# Patient Record
Sex: Male | Born: 1960 | Race: White | Hispanic: No | Smoking: Never smoker
Health system: Southern US, Community
[De-identification: ages and names within clinical notes are randomized; demographics above are authoritative.]

## PROBLEM LIST (undated history)

## (undated) DIAGNOSIS — I2510 Atherosclerotic heart disease of native coronary artery without angina pectoris: Secondary | ICD-10-CM

## (undated) DIAGNOSIS — R002 Palpitations: Secondary | ICD-10-CM

## (undated) DIAGNOSIS — E7800 Pure hypercholesterolemia, unspecified: Secondary | ICD-10-CM

## (undated) DIAGNOSIS — I10 Essential (primary) hypertension: Secondary | ICD-10-CM

## (undated) DIAGNOSIS — N4340 Spermatocele of epididymis, unspecified: Secondary | ICD-10-CM

## (undated) DIAGNOSIS — I240 Acute coronary thrombosis not resulting in myocardial infarction: Secondary | ICD-10-CM

## (undated) DIAGNOSIS — I259 Chronic ischemic heart disease, unspecified: Secondary | ICD-10-CM

## (undated) DIAGNOSIS — Z87442 Personal history of urinary calculi: Secondary | ICD-10-CM

## (undated) DIAGNOSIS — E079 Disorder of thyroid, unspecified: Secondary | ICD-10-CM

## (undated) DIAGNOSIS — M12812 Other specific arthropathies, not elsewhere classified, left shoulder: Secondary | ICD-10-CM

## (undated) DIAGNOSIS — Z8489 Family history of other specified conditions: Secondary | ICD-10-CM

## (undated) DIAGNOSIS — R7303 Prediabetes: Secondary | ICD-10-CM

## (undated) DIAGNOSIS — Z7902 Long term (current) use of antithrombotics/antiplatelets: Secondary | ICD-10-CM

## (undated) DIAGNOSIS — I214 Non-ST elevation (NSTEMI) myocardial infarction: Secondary | ICD-10-CM

## (undated) DIAGNOSIS — I4510 Unspecified right bundle-branch block: Secondary | ICD-10-CM

## (undated) DIAGNOSIS — I519 Heart disease, unspecified: Secondary | ICD-10-CM

---

## 2006-09-01 DIAGNOSIS — E079 Disorder of thyroid, unspecified: Secondary | ICD-10-CM

## 2006-09-01 HISTORY — PX: THYROIDECTOMY, PARTIAL: SHX18

## 2006-09-01 HISTORY — PX: THYROID SURGERY: SHX805

## 2006-09-01 HISTORY — DX: Disorder of thyroid, unspecified: E07.9

## 2014-07-02 DIAGNOSIS — I2510 Atherosclerotic heart disease of native coronary artery without angina pectoris: Secondary | ICD-10-CM

## 2014-07-02 HISTORY — DX: Atherosclerotic heart disease of native coronary artery without angina pectoris: I25.10

## 2014-07-17 ENCOUNTER — Ambulatory Visit: Payer: Self-pay

## 2014-07-25 DIAGNOSIS — I10 Essential (primary) hypertension: Secondary | ICD-10-CM | POA: Insufficient documentation

## 2014-07-25 DIAGNOSIS — R002 Palpitations: Secondary | ICD-10-CM | POA: Insufficient documentation

## 2014-07-25 DIAGNOSIS — E782 Mixed hyperlipidemia: Secondary | ICD-10-CM | POA: Insufficient documentation

## 2014-07-25 DIAGNOSIS — E039 Hypothyroidism, unspecified: Secondary | ICD-10-CM | POA: Insufficient documentation

## 2014-07-25 DIAGNOSIS — I214 Non-ST elevation (NSTEMI) myocardial infarction: Secondary | ICD-10-CM

## 2014-07-25 DIAGNOSIS — E785 Hyperlipidemia, unspecified: Secondary | ICD-10-CM | POA: Insufficient documentation

## 2014-07-25 HISTORY — DX: Non-ST elevation (NSTEMI) myocardial infarction: I21.4

## 2014-07-25 HISTORY — DX: Hypothyroidism, unspecified: E03.9

## 2014-07-26 DIAGNOSIS — I214 Non-ST elevation (NSTEMI) myocardial infarction: Secondary | ICD-10-CM

## 2014-07-26 HISTORY — DX: Non-ST elevation (NSTEMI) myocardial infarction: I21.4

## 2014-07-26 HISTORY — PX: CORONARY ANGIOPLASTY WITH STENT PLACEMENT: SHX49

## 2014-08-07 DIAGNOSIS — I2510 Atherosclerotic heart disease of native coronary artery without angina pectoris: Secondary | ICD-10-CM | POA: Insufficient documentation

## 2014-08-07 DIAGNOSIS — I259 Chronic ischemic heart disease, unspecified: Secondary | ICD-10-CM | POA: Insufficient documentation

## 2014-08-07 DIAGNOSIS — I240 Acute coronary thrombosis not resulting in myocardial infarction: Secondary | ICD-10-CM | POA: Insufficient documentation

## 2015-02-03 ENCOUNTER — Ambulatory Visit
Admission: EM | Admit: 2015-02-03 | Discharge: 2015-02-03 | Disposition: A | Discharge status: Home / Self Care | Payer: 59 | Attending: Family Medicine | Admitting: Family Medicine

## 2015-02-03 ENCOUNTER — Encounter: Payer: Self-pay | Admitting: Gynecology

## 2015-02-03 DIAGNOSIS — J0100 Acute maxillary sinusitis, unspecified: Secondary | ICD-10-CM

## 2015-02-03 DIAGNOSIS — J40 Bronchitis, not specified as acute or chronic: Secondary | ICD-10-CM | POA: Diagnosis not present

## 2015-02-03 HISTORY — DX: Essential (primary) hypertension: I10

## 2015-02-03 HISTORY — DX: Disorder of thyroid, unspecified: E07.9

## 2015-02-03 HISTORY — DX: Heart disease, unspecified: I51.9

## 2015-02-03 HISTORY — DX: Pure hypercholesterolemia, unspecified: E78.00

## 2015-02-03 MED ORDER — HYDROCOD POLST-CPM POLST ER 10-8 MG/5ML PO SUER: 5.0000 mL | Freq: Two times a day (BID) | ORAL | Status: DC | PRN

## 2015-02-03 MED ORDER — AMOXICILLIN-POT CLAVULANATE 875-125 MG PO TABS: 1.0000 | ORAL_TABLET | Freq: Two times a day (BID) | ORAL | Status: DC

## 2015-02-03 MED ORDER — FEXOFENADINE-PSEUDOEPHED ER 180-240 MG PO TB24: 1.0000 | ORAL_TABLET | Freq: Every day | ORAL | Status: DC

## 2015-02-03 MED ORDER — ALBUTEROL SULFATE HFA 108 (90 BASE) MCG/ACT IN AERS: 2.0000 | INHALATION_SPRAY | RESPIRATORY_TRACT | Status: DC | PRN

## 2015-02-03 NOTE — ED Provider Notes (Signed)
CSN: [id number]     Arrival date & time 02/03/15  0806 History   First MD Initiated Contact with Patient 02/03/15 0901     Chief Complaint  Patient presents with  . Facial Pain    Patient was seen at urgent care near [facility] about a week and a half ago with his wife. He is placed on prednisone the cough medicine initially this was just for the coughing and the prednisone seemed to help. 3 days ago he started having sinus pressure and congestion.  (Consider location/radiation/quality/duration/timing/severity/associated sxs/prior Treatment) Patient is a 54 y.o. male presenting with URI. The history is provided by the patient. No language interpreter was used.  URI Presenting symptoms: congestion, cough, facial pain and rhinorrhea   Presenting symptoms: no ear pain, no fatigue, no fever and no sore throat   Congestion:    Location:  Nasal and chest   Interferes with sleep: yes     Interferes with eating/drinking: no   Cough:    Cough characteristics:  Hacking, nocturnal and productive   Sputum characteristics:  Green   Severity:  Moderate   Duration:  2 weeks   Timing:  Intermittent   Progression:  Waxing and waning   Chronicity:  New Severity:  Moderate Duration:  3 days Timing:  Constant Progression:  Worsening Chronicity:  New Relieved by:  Nothing Worsened by:  Nothing tried Associated symptoms: sinus pain and wheezing   Associated symptoms: no arthralgias, no headaches, no myalgias, no neck pain and no sneezing   Risk factors: chronic cardiac disease, recent illness and sick contacts   Risk factors: not elderly, no chronic kidney disease, no chronic respiratory disease, no diabetes mellitus, no immunosuppression and no recent travel     Past Medical History  Diagnosis Date  . Hypertension   . Thyroid disease   . Hypercholesteremia   . Heart disease    Past Surgical History  Procedure Laterality Date  . Cardiac surgery      stent placement  . Thyroid surgery     partial thyriod remove   History reviewed. No pertinent family history. History  Substance Use Topics  . Smoking status: Never Smoker   . Smokeless tobacco: Not on file  . Alcohol Use: 0.6 oz/week    1 Cans of beer per week     Comment: occassionally    Review of Systems  Constitutional: Negative for fever and fatigue.  HENT: Positive for congestion and rhinorrhea. Negative for ear pain, sneezing and sore throat.   Respiratory: Positive for cough and wheezing.   Musculoskeletal: Negative for myalgias, arthralgias and neck pain.  Neurological: Negative for headaches.  All other systems reviewed and are negative.   Allergies  Septra  Home Medications   Prior to Admission medications   Medication Sig Start Date End Date Taking? Authorizing Provider  atorvastatin (LIPITOR) 40 MG tablet Take 40 mg by mouth daily.   Yes Historical Provider, MD  levothyroxine (SYNTHROID, LEVOTHROID) 100 MCG tablet Take 10 mcg by mouth daily before breakfast.    Yes Historical Provider, MD  metoprolol succinate (TOPROL-XL) 50 MG 24 hr tablet Take 50 mg by mouth daily. Take with or immediately following a meal.   Yes Historical Provider, MD  prasugrel (EFFIENT) 10 MG TABS tablet Take 10 mg by mouth daily.   Yes Historical Provider, MD  telmisartan-hydrochlorothiazide (MICARDIS HCT) 80-25 MG per tablet Take 1 tablet by mouth daily.   Yes Historical Provider, MD  albuterol (PROVENTIL HFA;VENTOLIN HFA) 108 (90  BASE) MCG/ACT inhaler Inhale 2 puffs into the lungs every 4 (four) hours as needed for wheezing or shortness of breath. 02/03/15   Frederich Cha, MD  amoxicillin-clavulanate (AUGMENTIN) 875-125 MG per tablet Take 1 tablet by mouth 2 (two) times daily. 02/03/15   Frederich Cha, MD  chlorpheniramine-HYDROcodone [facility] PENNKINETIC ER) 10-8 MG/5ML SUER Take 5 mLs by mouth every 12 (twelve) hours as needed for cough. 02/03/15   Frederich Cha, MD  fexofenadine-pseudoephedrine (ALLEGRA-D ALLERGY & CONGESTION) 180-240  MG per 24 hr tablet Take 1 tablet by mouth daily. 02/03/15   Frederich Cha, MD   BP 117/77 mmHg  Pulse 70  Temp(Src) 97.5 F (36.4 C) (Tympanic)  Ht 5\' 8"  (1.727 m)  Wt 240 lb (108.863 kg)  BMI 36.50 kg/m2  SpO2 98% Physical Exam  Constitutional: He is oriented to person, place, and time. He appears well-developed and well-nourished. No distress.  HENT:  Head: Normocephalic and atraumatic.  Right Ear: External ear normal.  Mouth/Throat: Oropharynx is clear and moist.  Eyes: Pupils are equal, round, and reactive to light.  Neck: Normal range of motion. Neck supple. No tracheal deviation present. No thyromegaly present.  Cardiovascular: Normal rate, regular rhythm and normal heart sounds.   Pulmonary/Chest: Effort normal and breath sounds normal. No respiratory distress.  Musculoskeletal: Normal range of motion. He exhibits no edema.  Neurological: He is alert and oriented to person, place, and time.  Skin: Skin is warm. He is not diaphoretic.  Psychiatric: His behavior is normal.  Vitals reviewed.   ED Course  Procedures (including critical care time) Labs Review Labs Reviewed - No data to display  Imaging Review No results found.   MDM   1. Acute maxillary sinusitis, recurrence not specified   2. Bronchitis        Frederich Cha, MD 02/04/15 [phone]

## 2015-02-03 NOTE — Discharge Instructions (Signed)
Sinusitis Sinusitis is redness, soreness, and puffiness (inflammation) of the air pockets in the bones of your face (sinuses). The redness, soreness, and puffiness can cause air and mucus to get trapped in your sinuses. This can allow germs to grow and cause an infection.  HOME CARE   Drink enough fluids to keep your pee (urine) clear or pale yellow.  Use a humidifier in your home.  Run a hot shower to create steam in the bathroom. Sit in the bathroom with the door closed. Breathe in the steam 3-4 times a day.  Put a warm, moist washcloth on your face 3-4 times a day, or as told by your doctor.  Use salt water sprays (saline sprays) to wet the thick fluid in your nose. This can help the sinuses drain.  Only take medicine as told by your doctor. GET HELP RIGHT AWAY IF:   Your pain gets worse.  You have very bad headaches.  You are sick to your stomach (nauseous).  You throw up (vomit).  You are very sleepy (drowsy) all the time.  Your face is puffy (swollen).  Your vision changes.  You have a stiff neck.  You have trouble breathing. MAKE SURE YOU:   Understand these instructions.  Will watch your condition.  Will get help right away if you are not doing well or get worse. Document Released: 02/04/2008 Document Revised: 05/12/2012 Document Reviewed: 03/23/2012 [facility] Patient Information 2015 [place], [place]. This information is not intended to replace advice given to you by your health care provider. Make sure you discuss any questions you have with your health care provider.  Upper Respiratory Infection, Adult An upper respiratory infection (URI) is also known as the common cold. It is often caused by a type of germ (virus). Colds are easily spread (contagious). You can pass it to others by kissing, coughing, sneezing, or drinking out of the same glass. Usually, you get better in 1 or 2 weeks.  HOME CARE   Only take medicine as told by your doctor.  Use a warm mist  humidifier or breathe in steam from a hot shower.  Drink enough water and fluids to keep your pee (urine) clear or pale yellow.  Get plenty of rest.  Return to work when your temperature is back to normal or as told by your doctor. You may use a face mask and wash your hands to stop your cold from spreading. GET HELP RIGHT AWAY IF:   After the first few days, you feel you are getting worse.  You have questions about your medicine.  You have chills, shortness of breath, or brown or red spit (mucus).  You have yellow or brown snot (nasal discharge) or pain in the face, especially when you bend forward.  You have a fever, puffy (swollen) neck, pain when you swallow, or white spots in the back of your throat.  You have a bad headache, ear pain, sinus pain, or chest pain.  You have a high-pitched whistling sound when you breathe in and out (wheezing).  You have a lasting cough or cough up blood.  You have sore muscles or a stiff neck. MAKE SURE YOU:   Understand these instructions.  Will watch your condition.  Will get help right away if you are not doing well or get worse. Document Released: 02/04/2008 Document Revised: 11/10/2011 Document Reviewed: 11/23/2013 [facility] Patient Information 2015 [place], [place]. This information is not intended to replace advice given to you by your health care provider. Make  sure you discuss any questions you have with your health care provider.

## 2015-02-03 NOTE — ED Notes (Signed)
Pt. C/o sinus infection / chest cold / coughing x 1 week.

## 2016-04-09 ENCOUNTER — Ambulatory Visit
Admission: EM | Admit: 2016-04-09 | Discharge: 2016-04-09 | Disposition: A | Discharge status: Home / Self Care | Payer: 59 | Attending: Family Medicine | Admitting: Family Medicine

## 2016-04-09 DIAGNOSIS — J069 Acute upper respiratory infection, unspecified: Secondary | ICD-10-CM

## 2016-04-09 MED ORDER — DOXYCYCLINE HYCLATE 100 MG PO CAPS: 100.0000 mg | ORAL_CAPSULE | Freq: Two times a day (BID) | ORAL | 0 refills | Status: AC

## 2016-04-09 NOTE — Discharge Instructions (Signed)
Recommend take OTC cough medication such as Delsym at night to help with cough. Start Doxycycline twice a day for 7 days. Recommend follow-up with your PCP if symptoms are not improving in 3 to 5 days.

## 2016-04-09 NOTE — ED Provider Notes (Signed)
CSN: [id number]     Arrival date & time 04/09/16  1141 History   First MD Initiated Contact with Patient 04/09/16 1212     Chief Complaint  Patient presents with  . Nasal Congestion   (Consider location/radiation/quality/duration/timing/severity/associated sxs/prior Treatment) Patient presents with chest congestion and cough for over 1 week. Occasional nasal drainage. No fever. No shortness of breath. Patient is planning to go to [place] for 2 weeks and requests medication in case illness gets worse.    The history is provided by the patient.  Cough  Cough characteristics:  Productive Sputum characteristics:  Yellow Severity:  Mild Onset quality:  Sudden Duration:  1 week Timing:  Constant Progression:  Unchanged Chronicity:  New Smoker: no   Ineffective treatments:  None tried Associated symptoms: no chest pain, no chills, no fever, no headaches, no shortness of breath and no sore throat     Past Medical History:  Diagnosis Date  . Heart disease   . Hypercholesteremia   . Hypertension   . Thyroid disease    Past Surgical History:  Procedure Laterality Date  . CARDIAC SURGERY     stent placement  . THYROID SURGERY     partial thyriod remove   History reviewed. No pertinent family history. Social History  Substance Use Topics  . Smoking status: Never Smoker  . Smokeless tobacco: Never Used  . Alcohol use 0.6 oz/week    1 Cans of beer per week     Comment: occassionally    Review of Systems  Constitutional: Negative for chills and fever.  HENT: Positive for congestion and postnasal drip. Negative for sore throat.   Respiratory: Positive for cough. Negative for shortness of breath.   Cardiovascular: Negative for chest pain.  Neurological: Negative for dizziness and headaches.    Allergies  Septra [sulfamethoxazole-trimethoprim]  Home Medications   Prior to Admission medications   Medication Sig Start Date End Date Taking? Authorizing Provider  atorvastatin  (LIPITOR) 40 MG tablet Take 40 mg by mouth daily.   Yes Historical Provider, MD  levothyroxine (SYNTHROID, LEVOTHROID) 100 MCG tablet Take 10 mcg by mouth daily before breakfast.    Yes Historical Provider, MD  metoprolol succinate (TOPROL-XL) 50 MG 24 hr tablet Take 50 mg by mouth daily. Take with or immediately following a meal.   Yes Historical Provider, MD  prasugrel (EFFIENT) 10 MG TABS tablet Take 10 mg by mouth daily.   Yes Historical Provider, MD  telmisartan-hydrochlorothiazide (MICARDIS HCT) 80-25 MG per tablet Take 1 tablet by mouth daily.   Yes Historical Provider, MD  albuterol (PROVENTIL HFA;VENTOLIN HFA) 108 (90 BASE) MCG/ACT inhaler Inhale 2 puffs into the lungs every 4 (four) hours as needed for wheezing or shortness of breath. 02/03/15   Frederich Cha, MD  doxycycline (VIBRAMYCIN) 100 MG capsule Take 1 capsule (100 mg total) by mouth 2 (two) times daily. 04/09/16 04/16/16  Katy Apo, NP  fexofenadine-pseudoephedrine (ALLEGRA-D ALLERGY & CONGESTION) 180-240 MG per 24 hr tablet Take 1 tablet by mouth daily. 02/03/15   Frederich Cha, MD   Meds Ordered and Administered this Visit  Medications - No data to display  BP 135/76 (BP Location: Left Arm)   Pulse 60   Temp 97.7 F (36.5 C) (Oral)   Resp 19   Ht 5\' 8"  (1.727 m)   Wt 245 lb (111.1 kg)   SpO2 97%   BMI 37.25 kg/m  No data found.   Physical Exam  Constitutional: He is oriented to person,  place, and time. He appears well-developed and well-nourished. No distress.  HENT:  Head: Normocephalic and atraumatic.  Right Ear: Hearing, tympanic membrane, external ear and ear canal normal.  Left Ear: Hearing, tympanic membrane, external ear and ear canal normal.  Nose: Rhinorrhea present. Right sinus exhibits no maxillary sinus tenderness and no frontal sinus tenderness. Left sinus exhibits no maxillary sinus tenderness and no frontal sinus tenderness.  Mouth/Throat: Uvula is midline and mucous membranes are normal. Posterior  oropharyngeal erythema (posterior yellow drainage present) present.  Neck: Normal range of motion. Neck supple.  Cardiovascular: Normal rate, regular rhythm and normal heart sounds.   Pulmonary/Chest: Effort normal and breath sounds normal. He has no wheezes. He has no rhonchi. He has no rales.  Lymphadenopathy:    He has no cervical adenopathy.  Neurological: He is alert and oriented to person, place, and time.  Skin: Skin is warm and dry.    Urgent Care Course   Clinical Course    Procedures (including critical care time)  Labs Review Labs Reviewed - No data to display  Imaging Review No results found.   Visual Acuity Review  Right Eye Distance:   Left Eye Distance:   Bilateral Distance:    Right Eye Near:   Left Eye Near:    Bilateral Near:         MDM   1. Acute upper respiratory infection    Discussed that symptoms probably are related to a viral illness. However, since patient is traveling within the next week to areas where there may be limited health care providers, may fill Rx for Doxycycline 100mg  twice a day for 7 days. If cough and congestion worsen in the next 2 to 3 days, start Doxycycline as directed. Otherwise, use OTC Delsym at night for cough. Increase fluids to help with thinning mucus and get sufficient rest. Follow-up with your PCP when returning if symptoms has not resolved.    Katy Apo, NP 04/09/16 1630

## 2016-04-09 NOTE — ED Triage Notes (Signed)
Patient complains of chest tightness and head congestion, cough and drainage for about a week.

## 2016-08-01 HISTORY — PX: COLONOSCOPY: SHX174

## 2016-08-15 IMAGING — CR DG CHEST 2V
2 series · 2 of 2 positions shown · non-contrast
Comparison: None.

CLINICAL DATA: Trouble catching breath after being active for 5
days

EXAM:
CHEST  2 VIEW

[chest lat]
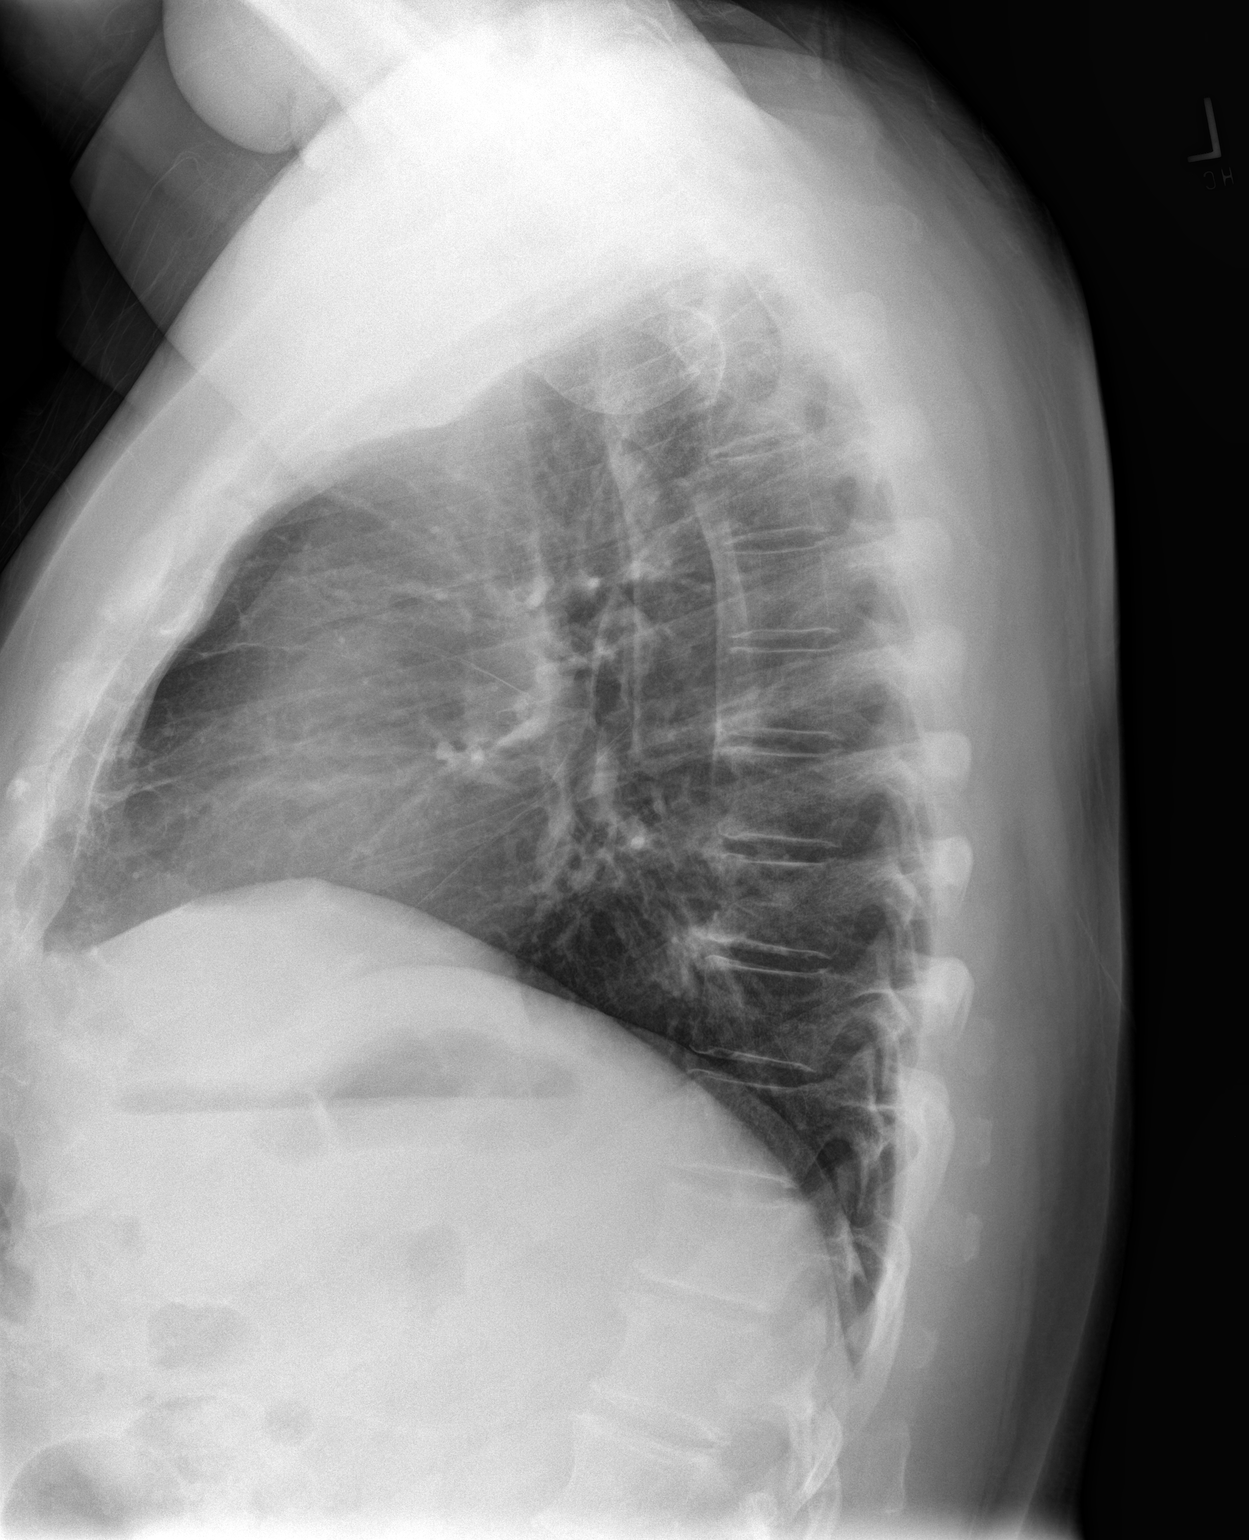

[chest pa]
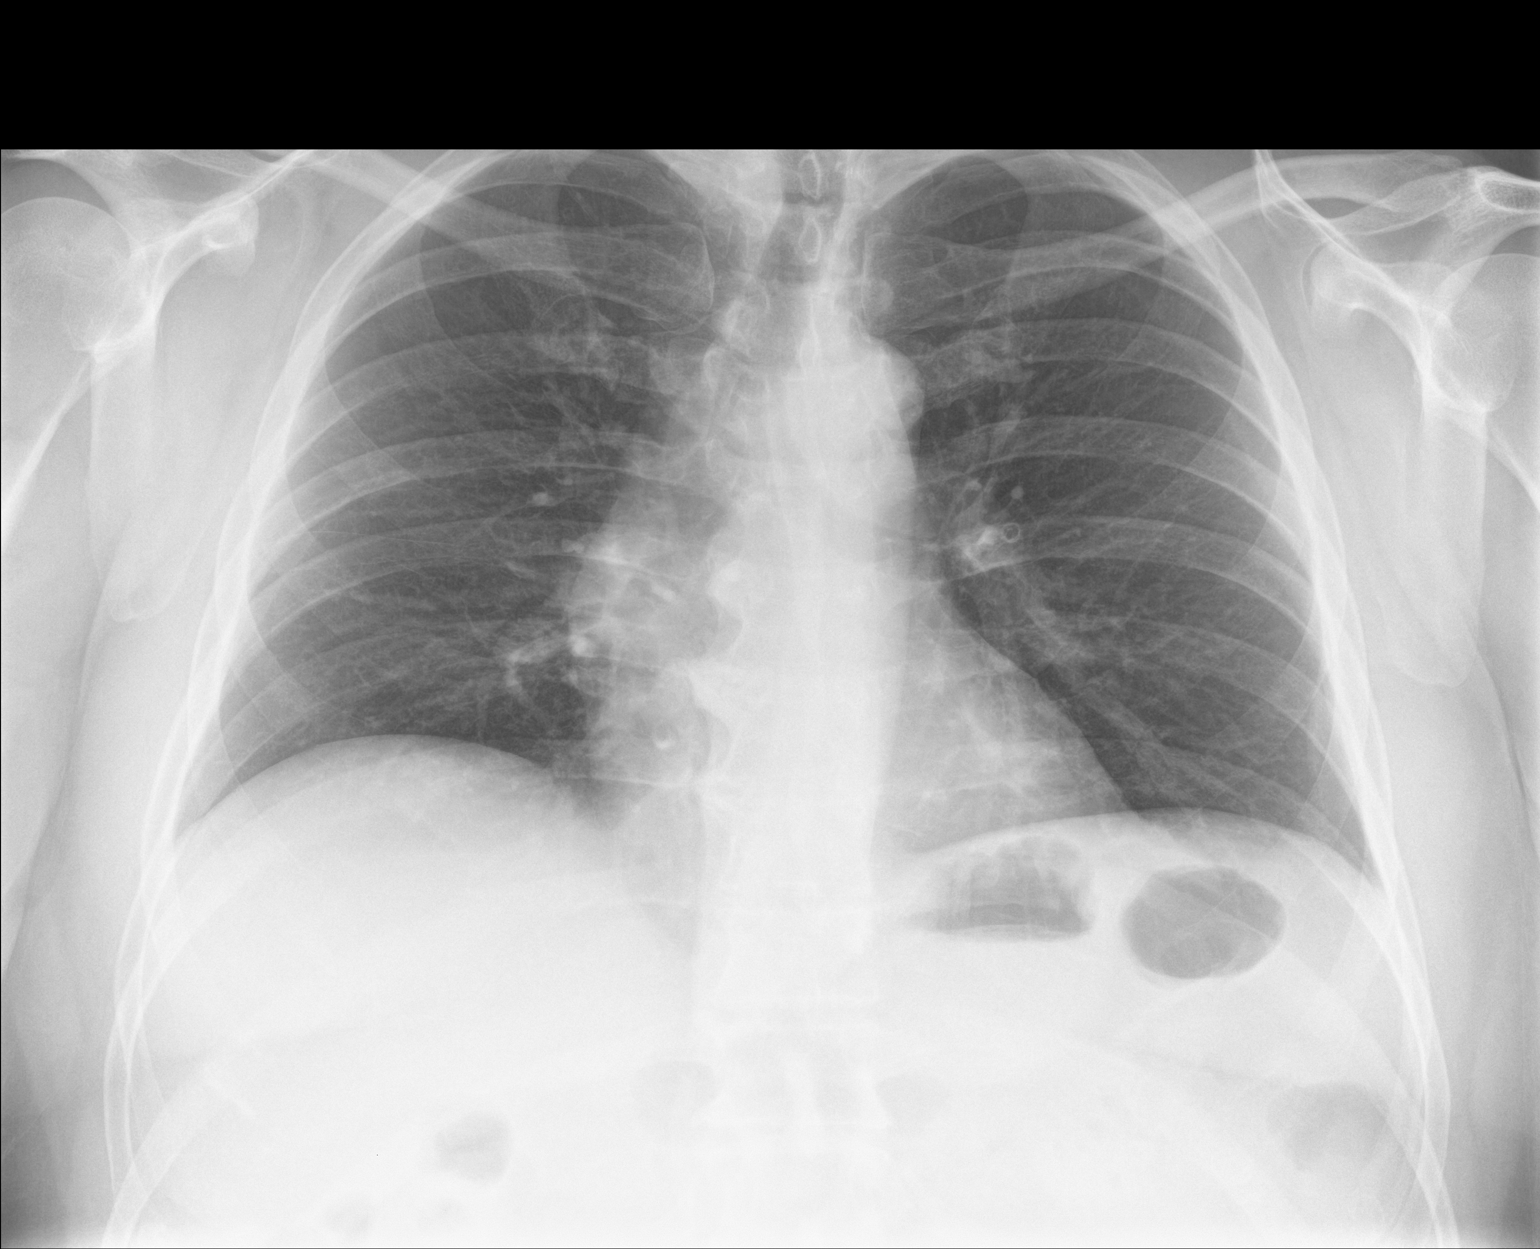

[2 of 2 positions shown; findings below may reference images not displayed]

FINDINGS: The heart size and mediastinal contours are within normal limits.
Both lungs are clear. The visualized skeletal structures are
unremarkable.
IMPRESSION: No active cardiopulmonary disease.

## 2016-09-24 ENCOUNTER — Encounter: Payer: Self-pay | Admitting: *Deleted

## 2016-09-24 ENCOUNTER — Ambulatory Visit
Admission: EM | Admit: 2016-09-24 | Discharge: 2016-09-24 | Disposition: A | Discharge status: Home / Self Care | Payer: 59 | Attending: Family Medicine | Admitting: Family Medicine

## 2016-09-24 DIAGNOSIS — B9789 Other viral agents as the cause of diseases classified elsewhere: Secondary | ICD-10-CM

## 2016-09-24 DIAGNOSIS — J069 Acute upper respiratory infection, unspecified: Secondary | ICD-10-CM

## 2016-09-24 MED ORDER — ALBUTEROL SULFATE HFA 108 (90 BASE) MCG/ACT IN AERS: 2.0000 | INHALATION_SPRAY | Freq: Four times a day (QID) | RESPIRATORY_TRACT | 0 refills | Status: DC | PRN

## 2016-09-24 MED ORDER — BENZONATATE 100 MG PO CAPS: 100.0000 mg | ORAL_CAPSULE | Freq: Three times a day (TID) | ORAL | 0 refills | Status: DC | PRN

## 2016-09-24 MED ORDER — HYDROCOD POLST-CPM POLST ER 10-8 MG/5ML PO SUER: 5.0000 mL | Freq: Every evening | ORAL | 0 refills | Status: DC | PRN

## 2016-09-24 NOTE — ED Triage Notes (Signed)
Patient started having cough and headache symptoms yesterday. No other symptoms reported.

## 2016-09-24 NOTE — ED Provider Notes (Signed)
[facility] ____________________________________________  Time seen: Approximately 1250 PM  I have reviewed the triage vital signs and the nursing notes.   HISTORY  Chief Complaint Cough and Headache   HPI Richard Underwood. is a 56 y.o. male presenting for the complaints of cough and congestion symptoms since yesterday. States biggest complaint is dry cough. Reports did not sleep last night well because of cough. States occasional wheeze when pushing all air out with exhalation. Denies any other wheezing. States cough is nonproductive. States some nasal congestion and sinus congestion. Denies fevers, chills or body aches. Reports wife similar symptoms last week. Denies other known sick contacts. Reports continues to eat and drink well.  Denies chest pain, shortness of breath, abdominal pain, dysuria, extremity pain, extremity swelling or rash. Denies recent sickness. Denies recent antibiotic use.   Hoyt Koch, MD: PCP  Past Medical History:  Diagnosis Date  . Heart disease   . Hypercholesteremia   . Hypertension   . Thyroid disease     There are no active problems to display for this patient.   Past Surgical History:  Procedure Laterality Date  . CARDIAC SURGERY     stent placement  . THYROID SURGERY     partial thyriod remove     No current facility-administered medications for this encounter.   Current Outpatient Prescriptions:  .  atorvastatin (LIPITOR) 80 MG tablet, Take 80 mg by mouth daily at 6 PM., Disp: , Rfl:  .  levothyroxine (SYNTHROID, LEVOTHROID) 100 MCG tablet, Take 10 mcg by mouth daily before breakfast. , Disp: , Rfl:  .  metoprolol succinate (TOPROL-XL) 50 MG 24 hr tablet, Take 50 mg by mouth daily. Take with or immediately following a meal., Disp: , Rfl:  .  prasugrel (EFFIENT) 10 MG TABS tablet, Take 10 mg by mouth daily., Disp: , Rfl:  .  telmisartan-hydrochlorothiazide (MICARDIS HCT) 80-25 MG per tablet, Take 1 tablet by  mouth daily., Disp: , Rfl:  .  albuterol (PROVENTIL HFA;VENTOLIN HFA) 108 (90 Base) MCG/ACT inhaler, Inhale 2 puffs into the lungs every 6 (six) hours as needed for wheezing., Disp: 1 Inhaler, Rfl: 0 .  atorvastatin (LIPITOR) 40 MG tablet, Take 40 mg by mouth daily., Disp: , Rfl:  .  benzonatate (TESSALON PERLES) 100 MG capsule, Take 1 capsule (100 mg total) by mouth 3 (three) times daily as needed for cough., Disp: 15 capsule, Rfl: 0 .  chlorpheniramine-HYDROcodone (TUSSIONEX PENNKINETIC ER) 10-8 MG/5ML SUER, Take 5 mLs by mouth at bedtime as needed for cough. do not drive or operate machinery while taking as can cause drowsiness., Disp: 75 mL, Rfl: 0 .  fexofenadine-pseudoephedrine (ALLEGRA-D ALLERGY & CONGESTION) 180-240 MG per 24 hr tablet, Take 1 tablet by mouth daily., Disp: 30 tablet, Rfl: 0  Allergies Septra [sulfamethoxazole-trimethoprim]  History reviewed. No pertinent family history.  Social History Social History  Substance Use Topics  . Smoking status: Never Smoker  . Smokeless tobacco: Never Used  . Alcohol use 0.6 oz/week    1 Cans of beer per week     Comment: occassionally    Review of Systems Constitutional: No fever/chills Eyes: No visual changes. ENT: No sore throat. Cardiovascular: Denies chest pain. Respiratory: Denies shortness of breath. Gastrointestinal: No abdominal pain.  No nausea, no vomiting.  No diarrhea.  No constipation. Genitourinary: Negative for dysuria. Musculoskeletal: Negative for back pain. Skin: Negative for rash. Neurological: Negative for headaches, focal weakness or numbness.  10-point ROS otherwise negative.  ____________________________________________  PHYSICAL EXAM:  VITAL SIGNS: ED Triage Vitals  Enc Vitals Group     BP 09/24/16 1143 125/82     Pulse Rate 09/24/16 1143 (!) 59     Resp 09/24/16 1143 16     Temp 09/24/16 1143 97.8 F (36.6 C)     Temp Source 09/24/16 1143 Oral     SpO2 09/24/16 1143 99 %     Weight  09/24/16 1144 250 lb (113.4 kg)     Height 09/24/16 1144 5\' 8"  (1.727 m)     Head Circumference --      Peak Flow --      Pain Score 09/24/16 1147 0     Pain Loc --      Pain Edu? --      Excl. in [place]? --    Constitutional: Alert and oriented. Well appearing and in no acute distress. Eyes: Conjunctivae are normal. PERRL. EOMI. Head: Atraumatic. No sinus tenderness to palpation. No swelling. No erythema.  Ears: no erythema, normal TMs bilaterally.   Nose:Nasal congestion. No rhinorrhea.   Mouth/Throat: Mucous membranes are moist. No pharyngeal erythema. No tonsillar swelling or exudate.  Neck: No stridor.  No cervical spine tenderness to palpation. Hematological/Lymphatic/Immunilogical: No cervical lymphadenopathy. Cardiovascular: Normal rate, regular rhythm. Grossly normal heart sounds.  Good peripheral circulation. Respiratory: Normal respiratory effort.  No retractions. No wheezes, rales or rhonchi. Good air movement. Dry intermittent cough noted in room.  Gastrointestinal: Soft and nontender.  No CVA tenderness. Musculoskeletal: Ambulatory with steady gait. No cervical, thoracic or lumbar tenderness to palpation. Neurologic:  Normal speech and language. No gait instability. Skin:  Skin appears warm, dry and intact. No rash noted. Psychiatric: Mood and affect are normal. Speech and behavior are normal. ___________________________________________   LABS (all labs ordered are listed, but only abnormal results are displayed)  Labs Reviewed - No data to display   PROCEDURES Procedures    INITIAL IMPRESSION / ASSESSMENT AND PLAN / ED COURSE  Pertinent labs & imaging results that were available during my care of the patient were reviewed by me and considered in my medical decision making (see chart for details).  Well-appearing patient. No acute distress. Presents for the complaints of 2 days of symptoms. Suspect viral upper respiratory infection. Discussed evaluation and  treatment of influenza, patient declined and declines Tamiflu. Will treat supportively with when necessary Tessalon Perles and when necessary Tussionex. Encourage rest, fluids and supportive care. Discussed strict follow-up and return parameters. Discussed indication, risks and benefits of medications with patient.  Discussed follow up with Primary care physician this week. Discussed follow up and return parameters including no resolution or any worsening concerns. Patient verbalized understanding and agreed to plan.   ____________________________________________   FINAL CLINICAL IMPRESSION(S) / ED DIAGNOSES  Final diagnoses:  Viral URI with cough     Discharge Medication List as of 09/24/2016  1:02 PM    START taking these medications   Details  benzonatate (TESSALON PERLES) 100 MG capsule Take 1 capsule (100 mg total) by mouth 3 (three) times daily as needed for cough., Starting Wed 09/24/2016, Normal    chlorpheniramine-HYDROcodone (TUSSIONEX PENNKINETIC ER) 10-8 MG/5ML SUER Take 5 mLs by mouth at bedtime as needed for cough. do not drive or operate machinery while taking as can cause drowsiness., Starting Wed 09/24/2016, Print        Note: This dictation was prepared with Dragon dictation along with smaller phrase technology. Any transcriptional errors that result from this process are unintentional.  Marylene Land, NP 09/24/16 1731

## 2016-09-24 NOTE — Discharge Instructions (Signed)
Take medication as prescribed. Rest. Drink plenty of fluids.  ° °Follow up with your primary care physician this week as needed. Return to Urgent care for new or worsening concerns.  ° °

## 2016-11-11 ENCOUNTER — Encounter: Payer: Self-pay | Admitting: Internal Medicine

## 2016-11-11 ENCOUNTER — Other Ambulatory Visit: Payer: Self-pay | Admitting: Internal Medicine

## 2016-11-11 ENCOUNTER — Ambulatory Visit (INDEPENDENT_AMBULATORY_CARE_PROVIDER_SITE_OTHER): Payer: 59 | Admitting: Internal Medicine

## 2016-11-11 VITALS — BP 118/80 | HR 87 | Ht 68.0 in | Wt 258.0 lb

## 2016-11-11 DIAGNOSIS — E291 Testicular hypofunction: Secondary | ICD-10-CM | POA: Diagnosis not present

## 2016-11-11 DIAGNOSIS — E890 Postprocedural hypothyroidism: Secondary | ICD-10-CM

## 2016-11-11 DIAGNOSIS — I10 Essential (primary) hypertension: Secondary | ICD-10-CM

## 2016-11-11 DIAGNOSIS — I2510 Atherosclerotic heart disease of native coronary artery without angina pectoris: Secondary | ICD-10-CM | POA: Diagnosis not present

## 2016-11-11 NOTE — Progress Notes (Signed)
Date:  11/11/2016   Name:  Richard Underwood [facility].   DOB:  [birth year]   MRN:  [id number]   Chief Complaint: [place] (Pt previous doctor is in [place], [place] and would like closer doctor for medication refills. Dr. Kenton Kingfisher. Need refill on levothyroxine. Pt would like to schedule appt for physical exam this year.)  Thyroid Problem  Presents for follow-up visit. Symptoms include palpitations. Patient reports no anxiety, cold intolerance, constipation, diaphoresis, fatigue, hair loss or leg swelling. The symptoms have been stable. His past medical history is significant for hyperlipidemia.  Hypertension  This is a chronic problem. The current episode started more than 1 year ago. The problem is unchanged. The problem is controlled. Associated symptoms include palpitations. Pertinent negatives include no chest pain, headaches or shortness of breath. Identifiable causes of hypertension include a thyroid problem.  Hyperlipidemia  This is a chronic problem. Pertinent negatives include no chest pain or shortness of breath. Current antihyperlipidemic treatment includes statins. The current treatment provides significant improvement of lipids.  He has been out of thyroid medication for about one month and questions if he needs it. He has hx of low testosterone and used injections in the past.  He has noticed decreased spontaneous erections recently so started taking an otc supplement.  He is wondering if he needs to be tested and start testosterone again.  Review of Systems  Constitutional: Negative for diaphoresis, fatigue and unexpected weight change.  Eyes: Negative for visual disturbance.  Respiratory: Negative for cough, chest tightness, shortness of breath and wheezing.   Cardiovascular: Positive for palpitations. Negative for chest pain and leg swelling.  Gastrointestinal: Negative for abdominal pain and constipation.  Endocrine: Negative for cold intolerance.  Genitourinary: Negative for  testicular pain.       Mild ED  Musculoskeletal: Negative for arthralgias and gait problem.  Skin: Negative for color change and rash.  Neurological: Negative for dizziness and headaches.  Hematological: Negative for adenopathy. Bruises/bleeds easily (due to prasugrel).  Psychiatric/Behavioral: Negative for dysphoric mood and sleep disturbance. The patient is not nervous/anxious.     Patient Active Problem List   Diagnosis Date Noted  . Coronary artery disease involving native coronary artery of native heart without angina pectoris 08/07/2014  . Hyperlipidemia 07/25/2014  . Hypertension 07/25/2014  . Hypothyroidism 07/25/2014  . Palpitations 07/25/2014    Prior to Admission medications   Medication Sig Start Date End Date Taking? Authorizing Provider  aspirin EC 81 MG tablet Take 81 mg by mouth.   Yes Historical Provider, MD  atorvastatin (LIPITOR) 80 MG tablet Take 80 mg by mouth daily at 6 PM.   Yes Historical Provider, MD  levothyroxine (SYNTHROID, LEVOTHROID) 25 MCG tablet Take 25 mcg by mouth daily before breakfast.   Yes Historical Provider, MD  metoprolol succinate (TOPROL-XL) 50 MG 24 hr tablet Take 50 mg by mouth daily. Take with or immediately following a meal.   Yes Historical Provider, MD  prasugrel (EFFIENT) 10 MG TABS tablet Take 10 mg by mouth daily.   Yes Historical Provider, MD  telmisartan-hydrochlorothiazide (MICARDIS HCT) 80-25 MG per tablet Take 1 tablet by mouth daily.   Yes Historical Provider, MD    Allergies  Allergen Reactions  . Septra [Sulfamethoxazole-Trimethoprim]     Blistered on penis  . Tetracyclines & Related Rash    Blister of skin    Past Surgical History:  Procedure Laterality Date  . COLONOSCOPY  08/2016   Physcian requested to do again in 3  years. Polyps were found.  . CORONARY ANGIOPLASTY WITH STENT PLACEMENT  07/26/2014   stent placement LAD  . THYROID SURGERY     partial thyriod remove    Social History  Substance Use Topics  .  Smoking status: Never Smoker  . Smokeless tobacco: Never Used  . Alcohol use 0.6 oz/week    1 Cans of beer per week     Comment: occassionally     Medication list has been reviewed and updated.   Physical Exam  Constitutional: He is oriented to person, place, and time. He appears well-developed. No distress.  HENT:  Head: Normocephalic and atraumatic.  Neck: Normal range of motion. Neck supple.  Cardiovascular: Normal rate, regular rhythm and normal heart sounds.   Pulmonary/Chest: Effort normal and breath sounds normal. No respiratory distress. He has no wheezes.  Musculoskeletal: Normal range of motion. He exhibits no edema or tenderness.  Neurological: He is alert and oriented to person, place, and time.  Skin: Skin is warm and dry. No rash noted.  Psychiatric: He has a normal mood and affect. His behavior is normal. Thought content normal.  Nursing note and vitals reviewed.   BP 118/80   Pulse 87   Ht 5\' 8"  (1.727 m)   Wt 258 lb (117 kg)   SpO2 95%   BMI 39.23 kg/m   Assessment and Plan: 1. Essential hypertension Controlled Call for refill when needed  2. Coronary artery disease involving native coronary artery of native heart without angina pectoris Followed by Cardiology  3. Postoperative hypothyroidism Check labs and advise on supplementation - Thyroid Panel With TSH  4. Hypogonadism in male Check AM labs - Testosterone   No orders of the defined types were placed in this encounter.   Halina Maidens, MD [facility]  11/11/2016

## 2016-11-12 ENCOUNTER — Telehealth: Payer: Self-pay

## 2016-11-12 NOTE — Telephone Encounter (Signed)
Pt called stating that he had a deep cleaning done on teeth today, and tooth will not stop bleeding. Informed pt to contact cardiologist to find out next step due to him being prescribed blood thinner EFFIENT through them.

## 2016-11-13 LAB — THYROID PANEL WITH TSH
Free Thyroxine Index: 2.1 (ref 1.2–4.9)
T3 Uptake Ratio: 27 % (ref 24–39)
T4, Total: 7.8 ug/dL (ref 4.5–12.0)
TSH: 2.19 u[IU]/mL (ref 0.450–4.500)

## 2016-11-13 LAB — TESTOSTERONE: Testosterone: 297 ng/dL (ref 264–916)

## 2017-01-01 ENCOUNTER — Other Ambulatory Visit: Payer: Self-pay | Admitting: Internal Medicine

## 2017-01-02 ENCOUNTER — Other Ambulatory Visit: Payer: Self-pay | Admitting: Internal Medicine

## 2017-04-20 ENCOUNTER — Encounter: Payer: 59 | Admitting: Internal Medicine

## 2017-04-24 ENCOUNTER — Ambulatory Visit (INDEPENDENT_AMBULATORY_CARE_PROVIDER_SITE_OTHER): Payer: 59 | Admitting: Internal Medicine

## 2017-04-24 ENCOUNTER — Encounter: Payer: Self-pay | Admitting: Internal Medicine

## 2017-04-24 ENCOUNTER — Other Ambulatory Visit
Admission: RE | Admit: 2017-04-24 | Discharge: 2017-04-24 | Disposition: A | Discharge status: Home / Self Care | Payer: 59 | Source: Ambulatory Visit | Attending: Internal Medicine | Admitting: Internal Medicine

## 2017-04-24 VITALS — BP 130/84 | HR 53 | Ht 68.0 in | Wt 230.2 lb

## 2017-04-24 DIAGNOSIS — Z0000 Encounter for general adult medical examination without abnormal findings: Secondary | ICD-10-CM | POA: Diagnosis not present

## 2017-04-24 DIAGNOSIS — Z1159 Encounter for screening for other viral diseases: Secondary | ICD-10-CM

## 2017-04-24 DIAGNOSIS — I10 Essential (primary) hypertension: Secondary | ICD-10-CM

## 2017-04-24 DIAGNOSIS — Z125 Encounter for screening for malignant neoplasm of prostate: Secondary | ICD-10-CM

## 2017-04-24 DIAGNOSIS — I2510 Atherosclerotic heart disease of native coronary artery without angina pectoris: Secondary | ICD-10-CM

## 2017-04-24 DIAGNOSIS — E782 Mixed hyperlipidemia: Secondary | ICD-10-CM

## 2017-04-24 DIAGNOSIS — N451 Epididymitis: Secondary | ICD-10-CM

## 2017-04-24 LAB — COMPREHENSIVE METABOLIC PANEL
ALT: 22 U/L (ref 17–63)
AST: 26 U/L (ref 15–41)
Albumin: 4.3 g/dL (ref 3.5–5.0)
Alkaline Phosphatase: 96 U/L (ref 38–126)
Anion gap: 8 (ref 5–15)
BUN: 18 mg/dL (ref 6–20)
CO2: 30 mmol/L (ref 22–32)
CREATININE: 0.96 mg/dL (ref 0.61–1.24)
Calcium: 9.2 mg/dL (ref 8.9–10.3)
Chloride: 100 mmol/L — ABNORMAL LOW (ref 101–111)
Glucose, Bld: 108 mg/dL — ABNORMAL HIGH (ref 65–99)
Potassium: 4 mmol/L (ref 3.5–5.1)
Sodium: 138 mmol/L (ref 135–145)
Total Bilirubin: 0.8 mg/dL (ref 0.3–1.2)
Total Protein: 7.6 g/dL (ref 6.5–8.1)

## 2017-04-24 LAB — POCT URINALYSIS DIPSTICK
BILIRUBIN UA: NEGATIVE
Blood, UA: NEGATIVE
GLUCOSE UA: NEGATIVE
KETONES UA: NEGATIVE
Leukocytes, UA: NEGATIVE
Nitrite, UA: NEGATIVE
SPEC GRAV UA: 1.01 (ref 1.010–1.025)
Urobilinogen, UA: 0.2 E.U./dL
pH, UA: 5 (ref 5.0–8.0)

## 2017-04-24 LAB — CBC WITH DIFFERENTIAL/PLATELET
BASOS PCT: 1 %
Basophils Absolute: 0.1 10*3/uL (ref 0–0.1)
EOS ABS: 0.2 10*3/uL (ref 0–0.7)
Eosinophils Relative: 2 %
HCT: 43.5 % (ref 40.0–52.0)
Hemoglobin: 14.4 g/dL (ref 13.0–18.0)
Lymphocytes Relative: 36 %
Lymphs Abs: 2.7 10*3/uL (ref 1.0–3.6)
MCH: 26.9 pg (ref 26.0–34.0)
MCHC: 33.2 g/dL (ref 32.0–36.0)
MCV: 81.2 fL (ref 80.0–100.0)
Monocytes Absolute: 0.8 10*3/uL (ref 0.2–1.0)
Monocytes Relative: 10 %
NEUTROS PCT: 51 %
Neutro Abs: 3.7 10*3/uL (ref 1.4–6.5)
Platelets: 178 10*3/uL (ref 150–440)
RBC: 5.36 MIL/uL (ref 4.40–5.90)
RDW: 14.8 % — AB (ref 11.5–14.5)
WBC: 7.4 10*3/uL (ref 3.8–10.6)

## 2017-04-24 LAB — LIPID PANEL
Cholesterol: 144 mg/dL (ref 0–200)
HDL: 45 mg/dL (ref >40)
LDL CALC: 86 mg/dL (ref 0–99)
TRIGLYCERIDES: 64 mg/dL (ref <150)
Total CHOL/HDL Ratio: 3.2 RATIO
VLDL: 13 mg/dL (ref 0–40)

## 2017-04-24 LAB — TSH: TSH: 2.378 u[IU]/mL (ref 0.350–4.500)

## 2017-04-24 LAB — PSA: Prostatic Specific Antigen: 1.09 ng/mL (ref 0.00–4.00)

## 2017-04-24 MED ORDER — CIPROFLOXACIN HCL 500 MG PO TABS: 500.0000 mg | ORAL_TABLET | Freq: Two times a day (BID) | ORAL | 0 refills | Status: AC

## 2017-04-24 NOTE — Progress Notes (Signed)
Date:  04/24/2017   Name:  Richard Underwood Richard Underwood.   DOB:  [birth year]   MRN:  [id number]   Chief Complaint: Annual Exam Richard Underwood. is a 56 y.o. male who presents today for his Complete Annual Exam. He feels well. He reports exercising regularly. He reports he is sleeping well. He is losing weight on the Keto diet.  Hypertension  This is a chronic problem. The problem is controlled. Pertinent negatives include no chest pain, headaches, palpitations or shortness of breath. Past treatments include beta blockers, angiotensin blockers and diuretics. The current treatment provides significant improvement.  Hyperlipidemia  This is a chronic problem. Recent lipid tests were reviewed and are normal. Pertinent negatives include no chest pain, myalgias or shortness of breath. Current antihyperlipidemic treatment includes statins.  Testicular pain - she reports left testicular pain and swelling. This has been a recurrent issue over the past several years. Ultrasounds of showed a varicocele but no other abnormality. He denies penile discharge fever or chills.  CAD - stable with no recurrent angina.  Now on Effient indefinitely per cardiology.   Review of Systems  Constitutional: Negative for appetite change, chills, diaphoresis, fatigue and unexpected weight change.  HENT: Negative for hearing loss, tinnitus, trouble swallowing and voice change.   Eyes: Negative for visual disturbance.  Respiratory: Negative for choking, shortness of breath and wheezing.   Cardiovascular: Negative for chest pain, palpitations and leg swelling.  Gastrointestinal: Negative for abdominal pain, blood in stool, constipation and diarrhea.  Genitourinary: Positive for scrotal swelling and testicular pain. Negative for difficulty urinating, discharge, dysuria, frequency and hematuria.  Musculoskeletal: Negative for arthralgias, back pain and myalgias.  Skin: Negative for color change and rash.  Neurological:  Negative for dizziness, syncope and headaches.  Hematological: Negative for adenopathy. Bruises/bleeds easily.  Psychiatric/Behavioral: Negative for dysphoric mood and sleep disturbance.    Patient Active Problem List   Diagnosis Date Noted  . 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 Coronary artery disease involving native coronary artery of native heart without angina pectoris 08/07/2014  . Hyperlipidemia 07/25/2014  . Hypertension 07/25/2014  . Hypothyroidism 07/25/2014  . Palpitations 07/25/2014    Prior to Admission medications   Medication Sig Start Date End Date Taking? Authorizing Provider  aspirin EC 81 MG tablet Take 81 mg by mouth.   Yes [provider]  atorvastatin (LIPITOR) 80 MG tablet TAKE 1 TABLET EVERY DAY 01/02/17  Yes Glean Hess, MD  metoprolol succinate (TOPROL-XL) 50 MG 24 hr tablet Take 50 mg by mouth daily. Take with or immediately following a meal.   Yes [provider]  prasugrel (EFFIENT) 10 MG TABS tablet Take 10 mg by mouth daily.   Yes [provider]  telmisartan-hydrochlorothiazide (MICARDIS HCT) 80-25 MG tablet 1 BY MOUTH DAILY 01/02/17  Yes Glean Hess, MD    Allergies  Allergen Reactions  . Septra [Sulfamethoxazole-Trimethoprim]     Blistered on penis  . Tetracyclines & Related Rash    Blister of skin    Past Surgical History:  Procedure Laterality Date  . COLONOSCOPY  08/2016   Physcian requested to do again in 3 years. Polyps were found.  . CORONARY ANGIOPLASTY WITH STENT PLACEMENT  07/26/2014   stent placement LAD  . THYROID SURGERY  2008   partial thyroidectomy for benign mass    Social History  Substance Use Topics  . Smoking status: Never Smoker  . Smokeless tobacco: Never Used  . Alcohol use 0.6  oz/week    1 Cans of beer per week     Comment: occassionally   Depression screen [facility] 2/9 04/24/2017  Decreased Interest 0  Down, Depressed, Hopeless 0  PHQ - 2 Score 0     Medication list has been  reviewed and updated.   Physical Exam  Constitutional: He is oriented to person, place, and time. He appears well-developed and well-nourished.  HENT:  Head: Normocephalic.  Right Ear: Tympanic membrane, external ear and ear canal normal.  Left Ear: Tympanic membrane, external ear and ear canal normal.  Nose: Nose normal.  Mouth/Throat: Uvula is midline and oropharynx is clear and moist.  Eyes: Pupils are equal, round, and reactive to light. Conjunctivae and EOM are normal.  Neck: Normal range of motion. Neck supple. Carotid bruit is not present. No thyromegaly present.  Cardiovascular: Normal rate, regular rhythm, normal heart sounds and intact distal pulses.   Pulmonary/Chest: Effort normal and breath sounds normal. He has no wheezes. Right breast exhibits no mass. Left breast exhibits no mass.  Abdominal: Soft. Normal appearance and bowel sounds are normal. There is no hepatosplenomegaly. There is no tenderness.    Genitourinary: Penis normal. Right testis shows no mass, no swelling and no tenderness. Left testis shows swelling and tenderness. Left testis shows no mass. Circumcised.  Musculoskeletal: Normal range of motion.  Lymphadenopathy:    He has no cervical adenopathy.  Neurological: He is alert and oriented to person, place, and time. He has normal reflexes.  Skin: Skin is warm, dry and intact.  Psychiatric: He has a normal mood and affect. His speech is normal and behavior is normal. Judgment and thought content normal.  Nursing note and vitals reviewed.   BP 130/84   Pulse (!) 53   Ht 5\' 8"  (1.727 m)   Wt 230 lb 3.2 oz (104.4 kg)   SpO2 96%   BMI 35.00 kg/m   Assessment and Plan: 1. Annual physical exam Continue diet, exercise and weight loss - POCT urinalysis dipstick  2. Prostate cancer screening DRE deferred - PSA  3. Coronary artery disease involving native coronary artery of native heart without angina pectoris stable  4. Essential  hypertension controlled - CBC with Differential/Platelet - Comprehensive metabolic panel - TSH  5. Mixed hyperlipidemia On therapy - Lipid panel  6. Need for hepatitis C screening test - Hepatitis C antibody  7. Epididymitis advil or aleve as needed - ciprofloxacin (CIPRO) 500 MG tablet; Take 1 tablet (500 mg total) by mouth 2 (two) times daily.  Dispense: 20 tablet; Refill: 0   Meds ordered this encounter  Medications  . ciprofloxacin (CIPRO) 500 MG tablet    Sig: Take 1 tablet (500 mg total) by mouth 2 (two) times daily.    Dispense:  20 tablet    Refill:  0    Halina Maidens, MD [facility]  04/24/2017

## 2017-04-25 LAB — HEPATITIS C ANTIBODY: HCV AB: 0.2 {s_co_ratio} (ref 0.0–0.9)

## 2017-06-24 ENCOUNTER — Other Ambulatory Visit: Payer: Self-pay | Admitting: Internal Medicine

## 2018-01-08 ENCOUNTER — Other Ambulatory Visit: Payer: Self-pay

## 2018-01-08 MED ORDER — TELMISARTAN-HCTZ 80-25 MG PO TABS: ORAL_TABLET | ORAL | 0 refills | Status: DC

## 2018-01-08 MED ORDER — TELMISARTAN-HCTZ 80-25 MG PO TABS: ORAL_TABLET | ORAL | 3 refills | Status: DC

## 2018-01-08 MED ORDER — ATORVASTATIN CALCIUM 80 MG PO TABS: 80.0000 mg | ORAL_TABLET | Freq: Every day | ORAL | 3 refills | Status: DC

## 2018-01-08 MED ORDER — ATORVASTATIN CALCIUM 80 MG PO TABS: 80.0000 mg | ORAL_TABLET | Freq: Every day | ORAL | 0 refills | Status: DC

## 2018-04-08 ENCOUNTER — Other Ambulatory Visit: Payer: Self-pay

## 2018-04-08 ENCOUNTER — Ambulatory Visit
Admission: EM | Admit: 2018-04-08 | Discharge: 2018-04-08 | Disposition: A | Discharge status: Home / Self Care | Payer: 59 | Attending: Family Medicine | Admitting: Family Medicine

## 2018-04-08 ENCOUNTER — Encounter: Payer: Self-pay | Admitting: Emergency Medicine

## 2018-04-08 DIAGNOSIS — R079 Chest pain, unspecified: Secondary | ICD-10-CM | POA: Insufficient documentation

## 2018-04-08 DIAGNOSIS — I2510 Atherosclerotic heart disease of native coronary artery without angina pectoris: Secondary | ICD-10-CM | POA: Insufficient documentation

## 2018-04-08 DIAGNOSIS — R7989 Other specified abnormal findings of blood chemistry: Secondary | ICD-10-CM | POA: Diagnosis not present

## 2018-04-08 DIAGNOSIS — Z79899 Other long term (current) drug therapy: Secondary | ICD-10-CM | POA: Diagnosis not present

## 2018-04-08 DIAGNOSIS — R05 Cough: Secondary | ICD-10-CM | POA: Diagnosis not present

## 2018-04-08 DIAGNOSIS — Z882 Allergy status to sulfonamides status: Secondary | ICD-10-CM | POA: Insufficient documentation

## 2018-04-08 DIAGNOSIS — Z955 Presence of coronary angioplasty implant and graft: Secondary | ICD-10-CM | POA: Diagnosis not present

## 2018-04-08 DIAGNOSIS — Z833 Family history of diabetes mellitus: Secondary | ICD-10-CM | POA: Insufficient documentation

## 2018-04-08 DIAGNOSIS — E785 Hyperlipidemia, unspecified: Secondary | ICD-10-CM | POA: Diagnosis not present

## 2018-04-08 DIAGNOSIS — E890 Postprocedural hypothyroidism: Secondary | ICD-10-CM | POA: Insufficient documentation

## 2018-04-08 DIAGNOSIS — I10 Essential (primary) hypertension: Secondary | ICD-10-CM | POA: Diagnosis not present

## 2018-04-08 DIAGNOSIS — R059 Cough, unspecified: Secondary | ICD-10-CM

## 2018-04-08 DIAGNOSIS — Z881 Allergy status to other antibiotic agents status: Secondary | ICD-10-CM | POA: Diagnosis not present

## 2018-04-08 DIAGNOSIS — Z7902 Long term (current) use of antithrombotics/antiplatelets: Secondary | ICD-10-CM | POA: Insufficient documentation

## 2018-04-08 DIAGNOSIS — Z8249 Family history of ischemic heart disease and other diseases of the circulatory system: Secondary | ICD-10-CM | POA: Diagnosis not present

## 2018-04-08 DIAGNOSIS — Z7982 Long term (current) use of aspirin: Secondary | ICD-10-CM | POA: Diagnosis not present

## 2018-04-08 LAB — COMPREHENSIVE METABOLIC PANEL
ALK PHOS: 98 U/L (ref 38–126)
ALT: 22 U/L (ref 0–44)
AST: 29 U/L (ref 15–41)
Albumin: 4.2 g/dL (ref 3.5–5.0)
Anion gap: 14 (ref 5–15)
BUN: 23 mg/dL — ABNORMAL HIGH (ref 6–20)
CALCIUM: 9.3 mg/dL (ref 8.9–10.3)
CHLORIDE: 102 mmol/L (ref 98–111)
CO2: 25 mmol/L (ref 22–32)
CREATININE: 1.34 mg/dL — AB (ref 0.61–1.24)
GFR, EST NON AFRICAN AMERICAN: 57 mL/min — AB (ref >60)
Glucose, Bld: 105 mg/dL — ABNORMAL HIGH (ref 70–99)
Potassium: 3.4 mmol/L — ABNORMAL LOW (ref 3.5–5.1)
SODIUM: 141 mmol/L (ref 135–145)
Total Bilirubin: 0.5 mg/dL (ref 0.3–1.2)
Total Protein: 7.5 g/dL (ref 6.5–8.1)

## 2018-04-08 LAB — CBC WITH DIFFERENTIAL/PLATELET
Basophils Absolute: 0.1 10*3/uL (ref 0–0.1)
Basophils Relative: 1 %
EOS ABS: 0.2 10*3/uL (ref 0–0.7)
EOS PCT: 2 %
HCT: 44.1 % (ref 40.0–52.0)
Hemoglobin: 14.7 g/dL (ref 13.0–18.0)
LYMPHS ABS: 2.9 10*3/uL (ref 1.0–3.6)
Lymphocytes Relative: 32 %
MCH: 27.9 pg (ref 26.0–34.0)
MCHC: 33.3 g/dL (ref 32.0–36.0)
MCV: 83.6 fL (ref 80.0–100.0)
MONO ABS: 0.9 10*3/uL (ref 0.2–1.0)
MONOS PCT: 10 %
Neutro Abs: 4.9 10*3/uL (ref 1.4–6.5)
Neutrophils Relative %: 55 %
PLATELETS: 174 10*3/uL (ref 150–440)
RBC: 5.27 MIL/uL (ref 4.40–5.90)
RDW: 13.7 % (ref 11.5–14.5)
WBC: 8.9 10*3/uL (ref 3.8–10.6)

## 2018-04-08 LAB — TROPONIN I

## 2018-04-08 NOTE — Discharge Instructions (Addendum)
Rest.  Increase fluid intake.  Take care  Dr. Lacinda Axon

## 2018-04-08 NOTE — ED Triage Notes (Signed)
Patient states he developed chest tightness and a cough after a bike ride on Sunday.  States he has not "felt like himself this week".

## 2018-04-08 NOTE — ED Provider Notes (Signed)
[facility]    CSN: [id number] Arrival date & time: 04/08/18  1431  History   Chief Complaint Chief Complaint  Patient presents with  . Cough   HPI  57 year old male presents with chest tightness and cough.  Started on Sunday.  He states that he took a bike ride on Sunday.  Rode 16 miles without difficulty.  He did notice some chest tightness.  He has had a mild cough which is nonproductive.  He states that he is not feeling as he normally does.  No reports of significant shortness of breath.  He is able to exert himself without difficulty.  He did state that he had some palpitations/fluttering.  No reports of chest pain.  Reports of lower extremity edema.  He endorses compliance with his medications.  He has been doing well.  He is been following with cardiology regularly.  No fever.  He does report some "hot flashes".  No known living factors.  No other associated symptoms.  No other complaints.  Past Medical History:  Diagnosis Date  . Heart disease   . Hypercholesteremia   . Hypertension   . Thyroid disease     Patient Active Problem List   Diagnosis Date Noted  . 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 Coronary artery disease involving native coronary artery of native heart without angina pectoris 08/07/2014  . Hyperlipidemia 07/25/2014  . Hypertension 07/25/2014  . Hypothyroidism 07/25/2014  . Palpitations 07/25/2014    Past Surgical History:  Procedure Laterality Date  . COLONOSCOPY  08/2016   Physcian requested to do again in 3 years. Polyps were found.  . CORONARY ANGIOPLASTY WITH STENT PLACEMENT  07/26/2014   stent placement LAD  . THYROID SURGERY  2008   partial thyroidectomy for benign mass     Home Medications    Prior to Admission medications   Medication Sig Start Date End Date Taking? Authorizing Provider  aspirin EC 81 MG tablet Take 81 mg by mouth.   Yes [provider]  atorvastatin (LIPITOR) 80 MG tablet Take 1 tablet (80 mg  total) by mouth daily. 01/08/18  Yes Glean Hess, MD  metoprolol succinate (TOPROL-XL) 50 MG 24 hr tablet Take 50 mg by mouth daily. Take with or immediately following a meal.   Yes [provider]  prasugrel (EFFIENT) 10 MG TABS tablet Take 10 mg by mouth daily.   Yes [provider]  telmisartan-hydrochlorothiazide (MICARDIS HCT) 80-25 MG tablet 1 BY MOUTH DAILY 01/08/18  Yes Glean Hess, MD    Family History Family History  Problem Relation Age of Onset  . Diabetes Mother   . Hypertension Mother   . Diabetes Maternal Grandmother   . Diabetes Maternal Grandfather     Social History Social History   Tobacco Use  . Smoking status: Never Smoker  . Smokeless tobacco: Never Used  Substance Use Topics  . Alcohol use: Yes    Alcohol/week: 1.0 standard drinks    Types: 1 Cans of beer per week    Comment: occassionally  . Drug use: No     Allergies   Septra [sulfamethoxazole-trimethoprim] and Tetracyclines & related   Review of Systems Review of Systems  Constitutional: Negative.   Respiratory: Positive for cough and chest tightness.    Physical Exam Triage Vital Signs ED Triage Vitals  Enc Vitals Group     BP 04/08/18 1449 (!) 155/91     Pulse Rate 04/08/18 1449 69     Resp 04/08/18  1449 18     Temp 04/08/18 1449 98.5 F (36.9 C)     Temp Source 04/08/18 1449 Oral     SpO2 04/08/18 1449 98 %     Weight 04/08/18 1444 226 lb (102.5 kg)     Height 04/08/18 1444 5\' 8"  (1.727 m)     Head Circumference --      Peak Flow --      Pain Score 04/08/18 1443 0     Pain Loc --      Pain Edu? --      Excl. in [place]? --    Updated Vital Signs BP (!) 155/91 (BP Location: Left Arm)   Pulse 69   Temp 98.5 F (36.9 C) (Oral)   Resp 18   Ht 5\' 8"  (1.727 m)   Wt 102.5 kg   SpO2 98%   BMI 34.36 kg/m   Visual Acuity Right Eye Distance:   Left Eye Distance:   Bilateral Distance:    Right Eye Near:   Left Eye Near:    Bilateral Near:      Physical Exam  Constitutional: He is oriented to person, place, and time. He appears well-developed. No distress.  HENT:  Head: Normocephalic and atraumatic.  Cardiovascular: Normal rate and regular rhythm.  Pulmonary/Chest: Effort normal and breath sounds normal. He has no wheezes. He has no rales.  Neurological: He is alert and oriented to person, place, and time.  Psychiatric: He has a normal mood and affect. His behavior is normal.  Nursing note and vitals reviewed.  UC Treatments / Results  Labs (all labs ordered are listed, but only abnormal results are displayed) Labs Reviewed  COMPREHENSIVE METABOLIC PANEL - Abnormal; Notable for the following components:      Result Value   Potassium 3.4 (*)    Glucose, Bld 105 (*)    BUN 23 (*)    Creatinine, Ser 1.34 (*)    GFR calc non Af Amer 57 (*)    All other components within normal limits  CBC WITH DIFFERENTIAL/PLATELET  TROPONIN I    EKG Interpretation: Normal sinus rhythm at the rate of 66.  Right bundle branch block.  No signs of ischemia.  Unchanged from prior EKG.  Radiology No results found.  Procedures Procedures (including critical care time)  Medications Ordered in UC Medications - No data to display  Initial Impression / Assessment and Plan / UC Course  I have reviewed the triage vital signs and the nursing notes.  Pertinent labs & imaging results that were available during my care of the patient were reviewed by me and considered in my medical decision making (see chart for details).    57 year old male presents with cough and chest tightness.  Patient has presented atypically regarding angina before.  As a result, EKG and laboratory studies were obtained.  Troponin negative.  EKG unchanged.  Lungs clear.  Does not appear to be secondary to acute bronchitis or pneumonia.  Likely viral.  Advised supportive care.  Offered albuterol, prednisone, and cough medication and patient elected to forego this.  Note,  patient had a slightly elevated creatinine.  Advised hydration.  Supportive care.  Final Clinical Impressions(s) / UC Diagnoses   Final diagnoses:  Cough  Elevated serum creatinine     Discharge Instructions     Rest.  Increase fluid intake.  Take care  Dr. Lacinda Axon     ED Prescriptions    None     Controlled Substance Prescriptions [place]  Controlled Substance Registry consulted? Not Applicable   Coral Spikes, DO 04/08/18 1610

## 2018-07-16 ENCOUNTER — Ambulatory Visit (INDEPENDENT_AMBULATORY_CARE_PROVIDER_SITE_OTHER): Payer: 59 | Admitting: Internal Medicine

## 2018-07-16 ENCOUNTER — Encounter: Payer: Self-pay | Admitting: Internal Medicine

## 2018-07-16 VITALS — BP 118/86 | HR 66 | Ht 68.0 in | Wt 239.0 lb

## 2018-07-16 DIAGNOSIS — I10 Essential (primary) hypertension: Secondary | ICD-10-CM

## 2018-07-16 DIAGNOSIS — E782 Mixed hyperlipidemia: Secondary | ICD-10-CM

## 2018-07-16 DIAGNOSIS — Z125 Encounter for screening for malignant neoplasm of prostate: Secondary | ICD-10-CM | POA: Diagnosis not present

## 2018-07-16 DIAGNOSIS — Z0000 Encounter for general adult medical examination without abnormal findings: Secondary | ICD-10-CM | POA: Diagnosis not present

## 2018-07-16 DIAGNOSIS — Z23 Encounter for immunization: Secondary | ICD-10-CM | POA: Diagnosis not present

## 2018-07-16 DIAGNOSIS — Z6836 Body mass index (BMI) 36.0-36.9, adult: Secondary | ICD-10-CM | POA: Diagnosis not present

## 2018-07-16 DIAGNOSIS — E890 Postprocedural hypothyroidism: Secondary | ICD-10-CM | POA: Diagnosis not present

## 2018-07-16 DIAGNOSIS — D126 Benign neoplasm of colon, unspecified: Secondary | ICD-10-CM | POA: Diagnosis not present

## 2018-07-16 LAB — POCT URINALYSIS DIPSTICK
Bilirubin, UA: NEGATIVE
Blood, UA: NEGATIVE
GLUCOSE UA: NEGATIVE
Ketones, UA: NEGATIVE
LEUKOCYTES UA: NEGATIVE
Nitrite, UA: NEGATIVE
Protein, UA: POSITIVE — AB
Spec Grav, UA: 1.01 (ref 1.010–1.025)
Urobilinogen, UA: 0.2 E.U./dL
pH, UA: 5 (ref 5.0–8.0)

## 2018-07-16 NOTE — Patient Instructions (Signed)
Tdap Vaccine (Tetanus, Diphtheria and Pertussis): What You Need to Know 1. Why get vaccinated? Tetanus, diphtheria and pertussis are very serious diseases. Tdap vaccine can protect us from these diseases. And, Tdap vaccine given to pregnant women can protect newborn babies against pertussis. TETANUS (Lockjaw) is rare in the [place] today. It causes painful muscle tightening and stiffness, usually all over the body.  It can lead to tightening of muscles in the head and neck so you can't open your mouth, swallow, or sometimes even breathe. Tetanus kills about 1 out of 10 people who are infected even after receiving the best medical care.  DIPHTHERIA is also rare in the [place] today. It can cause a thick coating to form in the back of the throat.  It can lead to breathing problems, heart failure, paralysis, and death.  PERTUSSIS (Whooping Cough) causes severe coughing spells, which can cause difficulty breathing, vomiting and disturbed sleep.  It can also lead to weight loss, incontinence, and rib fractures. Up to 2 in 100 adolescents and 5 in 100 adults with pertussis are hospitalized or have complications, which could include pneumonia or death.  These diseases are caused by bacteria. Diphtheria and pertussis are spread from person to person through secretions from coughing or sneezing. Tetanus enters the body through cuts, scratches, or wounds. Before vaccines, as many as 200,000 cases of diphtheria, 200,000 cases of pertussis, and hundreds of cases of tetanus, were reported in the [place] each year. Since vaccination began, reports of cases for tetanus and diphtheria have dropped by about 99% and for pertussis by about 80%. 2. Tdap vaccine Tdap vaccine can protect adolescents and adults from tetanus, diphtheria, and pertussis. One dose of Tdap is routinely given at age 11 or 12. People who did not get Tdap at that age should get it as soon as possible. Tdap is especially  important for healthcare professionals and anyone having close contact with a baby younger than 12 months. Pregnant women should get a dose of Tdap during every pregnancy, to protect the newborn from pertussis. Infants are most at risk for severe, life-threatening complications from pertussis. Another vaccine, called Td, protects against tetanus and diphtheria, but not pertussis. A Td booster should be given every 10 years. Tdap may be given as one of these boosters if you have never gotten Tdap before. Tdap may also be given after a severe cut or burn to prevent tetanus infection. Your doctor or the person giving you the vaccine can give you more information. Tdap may safely be given at the same time as other vaccines. 3. Some people should not get this vaccine  A person who has ever had a life-threatening allergic reaction after a previous dose of any diphtheria, tetanus or pertussis containing vaccine, OR has a severe allergy to any part of this vaccine, should not get Tdap vaccine. Tell the person giving the vaccine about any severe allergies.  Anyone who had coma or long repeated seizures within 7 days after a childhood dose of DTP or DTaP, or a previous dose of Tdap, should not get Tdap, unless a cause other than the vaccine was found. They can still get Td.  Talk to your doctor if you: ? have seizures or another nervous system problem, ? had severe pain or swelling after any vaccine containing diphtheria, tetanus or pertussis, ? ever had a condition called Guillain-Barr Syndrome (GBS), ? aren't feeling well on the day the shot is scheduled. 4. Risks With any medicine, including   vaccines, there is a chance of side effects. These are usually mild and go away on their own. Serious reactions are also possible but are rare. Most people who get Tdap vaccine do not have any problems with it. Mild problems following Tdap: (Did not interfere with activities)  Pain where the shot was given (about  3 in 4 adolescents or 2 in 3 adults)  Redness or swelling where the shot was given (about 1 person in 5)  Mild fever of at least 100.4F (up to about 1 in 25 adolescents or 1 in 100 adults)  Headache (about 3 or 4 people in 10)  Tiredness (about 1 person in 3 or 4)  Nausea, vomiting, diarrhea, stomach ache (up to 1 in 4 adolescents or 1 in 10 adults)  Chills, sore joints (about 1 person in 10)  Body aches (about 1 person in 3 or 4)  Rash, swollen glands (uncommon)  Moderate problems following Tdap: (Interfered with activities, but did not require medical attention)  Pain where the shot was given (up to 1 in 5 or 6)  Redness or swelling where the shot was given (up to about 1 in 16 adolescents or 1 in 12 adults)  Fever over 102F (about 1 in 100 adolescents or 1 in 250 adults)  Headache (about 1 in 7 adolescents or 1 in 10 adults)  Nausea, vomiting, diarrhea, stomach ache (up to 1 or 3 people in 100)  Swelling of the entire arm where the shot was given (up to about 1 in 500).  Severe problems following Tdap: (Unable to perform usual activities; required medical attention)  Swelling, severe pain, bleeding and redness in the arm where the shot was given (rare).  Problems that could happen after any vaccine:  People sometimes faint after a medical procedure, including vaccination. Sitting or lying down for about 15 minutes can help prevent fainting, and injuries caused by a fall. Tell your doctor if you feel dizzy, or have vision changes or ringing in the ears.  Some people get severe pain in the shoulder and have difficulty moving the arm where a shot was given. This happens very rarely.  Any medication can cause a severe allergic reaction. Such reactions from a vaccine are very rare, estimated at fewer than 1 in a million doses, and would happen within a few minutes to a few hours after the vaccination. As with any medicine, there is a very remote chance of a vaccine  causing a serious injury or death. The safety of vaccines is always being monitored. For more information, visit: www.cdc.gov/vaccinesafety/ 5. What if there is a serious problem? What should I look for? Look for anything that concerns you, such as signs of a severe allergic reaction, very high fever, or unusual behavior. Signs of a severe allergic reaction can include hives, swelling of the face and throat, difficulty breathing, a fast heartbeat, dizziness, and weakness. These would usually start a few minutes to a few hours after the vaccination. What should I do?  If you think it is a severe allergic reaction or other emergency that can't wait, call 9-1-1 or get the person to the nearest hospital. Otherwise, call your doctor.  Afterward, the reaction should be reported to the Vaccine Adverse Event Reporting System (VAERS). Your doctor might file this report, or you can do it yourself through the VAERS web site at www.vaers.hhs.gov, or by calling 1-800-822-7967. ? VAERS does not give medical advice. 6. The National Vaccine Injury Compensation Program The National   Vaccine Injury Compensation Program (VICP) is a federal program that was created to compensate people who may have been injured by certain vaccines. Persons who believe they may have been injured by a vaccine can learn about the program and about filing a claim by calling 1-800-338-2382 or visiting the VICP website at www.hrsa.gov/vaccinecompensation. There is a time limit to file a claim for compensation. 7. How can I learn more?  Ask your doctor. He or she can give you the vaccine package insert or suggest other sources of information.  Call your local or state health department.  Contact the Centers for Disease Control and Prevention (CDC): ? Call 1-800-232-4636 (1-800-CDC-INFO) or ? Visit CDC's website at www.cdc.gov/vaccines CDC Tdap Vaccine VIS (10/25/13) This information is not intended to replace advice given to you by your  health care provider. Make sure you discuss any questions you have with your health care provider. Document Released: 02/17/2012 Document Revised: 05/08/2016 Document Reviewed: 05/08/2016 Elsevier Interactive Patient Education  2017 Elsevier Inc.  

## 2018-07-16 NOTE — Progress Notes (Signed)
Date:  07/16/2018   Name:  Fran Mcree [facility]   DOB:  06-Dec-1960   MRN:  [id number]   Chief Complaint: Annual Exam Alycia Rossetti Kalev Temme. is a 57 y.o. male who presents today for his Complete Annual Exam. He feels well. He reports exercising daily. He reports he is sleeping well.   Hypertension  This is a chronic problem. The problem is controlled. Associated symptoms include palpitations. Pertinent negatives include no chest pain, headaches or shortness of breath. Past treatments include beta blockers, angiotensin blockers and diuretics. The current treatment provides significant improvement.  Hyperlipidemia  This is a chronic problem. The problem is controlled. Pertinent negatives include no chest pain, myalgias or shortness of breath. Current antihyperlipidemic treatment includes statins. The current treatment provides significant improvement of lipids. There are no compliance problems.   CAD - recently seen by cardiology.  No changes made.  No new sx. He exercises bike riding regularly. He will follow up yearly.  The plan is to continue Effient for now.  Review of Systems  Constitutional: Negative for appetite change, chills, diaphoresis, fatigue and unexpected weight change.  HENT: Negative for hearing loss, tinnitus, trouble swallowing and voice change.   Eyes: Negative for visual disturbance.  Respiratory: Negative for choking, shortness of breath and wheezing.   Cardiovascular: Positive for palpitations. Negative for chest pain and leg swelling.  Gastrointestinal: Negative for abdominal pain, blood in stool, constipation and diarrhea.       Occassional gerd  Genitourinary: Negative for difficulty urinating, dysuria, frequency, hematuria and urgency.  Musculoskeletal: Positive for arthralgias (both knees intermittently). Negative for back pain and myalgias.  Skin: Negative for color change and rash.  Allergic/Immunologic: Negative for environmental allergies.  Neurological:  Negative for dizziness, syncope and headaches.  Hematological: Negative for adenopathy. Bruises/bleeds easily.  Psychiatric/Behavioral: Negative for dysphoric mood and sleep disturbance.    Patient Active Problem List   Diagnosis Date Noted  . 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 Coronary artery disease involving native coronary artery of native heart without angina pectoris 08/07/2014  . Hyperlipidemia 07/25/2014  . Hypertension 07/25/2014  . Hypothyroidism 07/25/2014  . Palpitations 07/25/2014    Allergies  Allergen Reactions  . Septra [Sulfamethoxazole-Trimethoprim]     Blistered on penis  . Tetracyclines & Related Rash    Blister of skin    Past Surgical History:  Procedure Laterality Date  . COLONOSCOPY  08/2016   Physcian requested to do again in 3 years. Polyps were found.  . CORONARY ANGIOPLASTY WITH STENT PLACEMENT  07/26/2014   stent placement LAD  . THYROID SURGERY  2008   partial thyroidectomy for benign mass    Social History   Tobacco Use  . Smoking status: Never Smoker  . Smokeless tobacco: Never Used  Substance Use Topics  . Alcohol use: Yes    Alcohol/week: 1.0 standard drinks    Types: 1 Cans of beer per week    Comment: occassionally  . Drug use: No     Medication list has been reviewed and updated.  Current Meds  Medication Sig  . aspirin EC 81 MG tablet Take 81 mg by mouth.  Marland Kitchen atorvastatin (LIPITOR) 80 MG tablet Take 1 tablet (80 mg total) by mouth daily.  . metoprolol succinate (TOPROL-XL) 50 MG 24 hr tablet Take 50 mg by mouth daily. Take with or immediately following a meal.  . prasugrel (EFFIENT) 10 MG TABS tablet Take 10 mg by mouth daily.  Marland Kitchen telmisartan-hydrochlorothiazide (MICARDIS HCT)  80-25 MG tablet 1 BY MOUTH DAILY    PHQ 2/9 Scores 07/16/2018 04/24/2017  PHQ - 2 Score 0 0    Physical Exam  Constitutional: He is oriented to person, place, and time. He appears well-developed and well-nourished.  HENT:  Head: Normocephalic.    Right Ear: Tympanic membrane, external ear and ear canal normal.  Left Ear: Tympanic membrane, external ear and ear canal normal.  Nose: Nose normal.  Mouth/Throat: Uvula is midline and oropharynx is clear and moist.  Eyes: Pupils are equal, round, and reactive to light. Conjunctivae and EOM are normal.  Neck: Normal range of motion. Neck supple. Carotid bruit is not present. No thyromegaly present.  Cardiovascular: Normal rate, regular rhythm, normal heart sounds and intact distal pulses.  Pulmonary/Chest: Effort normal and breath sounds normal. He has no wheezes. Right breast exhibits no mass. Left breast exhibits no mass.  Abdominal: Soft. Normal appearance and bowel sounds are normal. There is no hepatosplenomegaly. There is no tenderness.  Musculoskeletal: Normal range of motion. He exhibits no edema or tenderness.  Lymphadenopathy:    He has no cervical adenopathy.  Neurological: He is alert and oriented to person, place, and time. He has normal reflexes.  Skin: Skin is warm, dry and intact.  Psychiatric: He has a normal mood and affect. His speech is normal and behavior is normal. Judgment and thought content normal.  Nursing note and vitals reviewed.   BP 118/86 (BP Location: Right Arm, Patient Position: Sitting, Cuff Size: Normal)   Pulse 66   Ht 5\' 8"  (1.727 m)   Wt 239 lb (108.4 kg)   SpO2 98%   BMI 36.34 kg/m   Assessment and Plan: 1. Annual physical exam Normal exam except for weight Continue to work on diet/exercise - POCT urinalysis dipstick  2. Prostate cancer screening DRE deferred - PSA  3. Tubular adenoma of colon Due next year for colonoscopy  4. Essential hypertension controlled - CBC with Differential/Platelet - Comprehensive metabolic panel  5. Postoperative hypothyroidism Check labs off of medications - Thyroid Panel With TSH  6. Mixed hyperlipidemia On statin therapy - Lipid panel  7. Need for diphtheria-tetanus-pertussis (Tdap)  vaccine - Tdap vaccine greater than or equal to 7yo IM  8. BMI 36.0-36.9,adult - Hemoglobin A1c   Partially dictated using Editor, commissioning. Any errors are unintentional.  Halina Maidens, MD [facility]  07/16/2018

## 2018-07-17 LAB — COMPREHENSIVE METABOLIC PANEL
A/G RATIO: 1.7 (ref 1.2–2.2)
ALK PHOS: 99 IU/L (ref 39–117)
ALT: 20 IU/L (ref 0–44)
AST: 23 IU/L (ref 0–40)
Albumin: 4.5 g/dL (ref 3.5–5.5)
BILIRUBIN TOTAL: 0.8 mg/dL (ref 0.0–1.2)
BUN/Creatinine Ratio: 15 (ref 9–20)
BUN: 15 mg/dL (ref 6–24)
CALCIUM: 9.6 mg/dL (ref 8.7–10.2)
CHLORIDE: 99 mmol/L (ref 96–106)
CO2: 24 mmol/L (ref 20–29)
Creatinine, Ser: 0.98 mg/dL (ref 0.76–1.27)
GFR calc Af Amer: 98 mL/min/{1.73_m2} (ref >59)
GFR, EST NON AFRICAN AMERICAN: 85 mL/min/{1.73_m2} (ref >59)
Globulin, Total: 2.6 g/dL (ref 1.5–4.5)
Glucose: 94 mg/dL (ref 65–99)
POTASSIUM: 3.9 mmol/L (ref 3.5–5.2)
Sodium: 139 mmol/L (ref 134–144)
Total Protein: 7.1 g/dL (ref 6.0–8.5)

## 2018-07-17 LAB — LIPID PANEL
CHOLESTEROL TOTAL: 150 mg/dL (ref 100–199)
Chol/HDL Ratio: 3 ratio (ref 0.0–5.0)
HDL: 50 mg/dL (ref >39)
LDL Calculated: 78 mg/dL (ref 0–99)
TRIGLYCERIDES: 109 mg/dL (ref 0–149)
VLDL Cholesterol Cal: 22 mg/dL (ref 5–40)

## 2018-07-17 LAB — HEMOGLOBIN A1C
Est. average glucose Bld gHb Est-mCnc: 120 mg/dL
Hgb A1c MFr Bld: 5.8 % — ABNORMAL HIGH (ref 4.8–5.6)

## 2018-07-17 LAB — CBC WITH DIFFERENTIAL/PLATELET
BASOS: 1 %
Basophils Absolute: 0.1 10*3/uL (ref 0.0–0.2)
EOS (ABSOLUTE): 0.2 10*3/uL (ref 0.0–0.4)
Eos: 2 %
HEMATOCRIT: 44.6 % (ref 37.5–51.0)
HEMOGLOBIN: 15.5 g/dL (ref 13.0–17.7)
Immature Grans (Abs): 0 10*3/uL (ref 0.0–0.1)
Immature Granulocytes: 0 %
LYMPHS ABS: 2.4 10*3/uL (ref 0.7–3.1)
Lymphs: 33 %
MCH: 28 pg (ref 26.6–33.0)
MCHC: 34.8 g/dL (ref 31.5–35.7)
MCV: 81 fL (ref 79–97)
MONOCYTES: 11 %
MONOS ABS: 0.8 10*3/uL (ref 0.1–0.9)
NEUTROS ABS: 3.7 10*3/uL (ref 1.4–7.0)
Neutrophils: 53 %
Platelets: 193 10*3/uL (ref 150–450)
RBC: 5.53 x10E6/uL (ref 4.14–5.80)
RDW: 12.8 % (ref 12.3–15.4)
WBC: 7.2 10*3/uL (ref 3.4–10.8)

## 2018-07-17 LAB — THYROID PANEL WITH TSH
FREE THYROXINE INDEX: 2.2 (ref 1.2–4.9)
T3 UPTAKE RATIO: 27 % (ref 24–39)
T4 TOTAL: 8.1 ug/dL (ref 4.5–12.0)
TSH: 2.17 u[IU]/mL (ref 0.450–4.500)

## 2018-07-17 LAB — PSA: Prostate Specific Ag, Serum: 1.1 ng/mL (ref 0.0–4.0)

## 2019-01-17 ENCOUNTER — Other Ambulatory Visit: Payer: Self-pay

## 2019-01-17 ENCOUNTER — Ambulatory Visit (INDEPENDENT_AMBULATORY_CARE_PROVIDER_SITE_OTHER): Payer: 59 | Admitting: Internal Medicine

## 2019-01-17 ENCOUNTER — Encounter: Payer: Self-pay | Admitting: Internal Medicine

## 2019-01-17 VITALS — BP 130/78 | HR 69 | Ht 68.0 in | Wt 249.2 lb

## 2019-01-17 DIAGNOSIS — Z23 Encounter for immunization: Secondary | ICD-10-CM | POA: Diagnosis not present

## 2019-01-17 DIAGNOSIS — E782 Mixed hyperlipidemia: Secondary | ICD-10-CM

## 2019-01-17 DIAGNOSIS — I10 Essential (primary) hypertension: Secondary | ICD-10-CM

## 2019-01-17 MED ORDER — ATORVASTATIN CALCIUM 80 MG PO TABS: 80.0000 mg | ORAL_TABLET | Freq: Every day | ORAL | 3 refills | Status: DC

## 2019-01-17 MED ORDER — TELMISARTAN-HCTZ 80-25 MG PO TABS: ORAL_TABLET | ORAL | 3 refills | Status: DC

## 2019-01-17 NOTE — Progress Notes (Signed)
Date:  01/17/2019   Name:  Richard Underwood [facility].   DOB:  1961-08-17   MRN:  [id number]   Chief Complaint: Hypertension (6 month follow up.)  Hypertension  This is a chronic problem. The problem is controlled. Pertinent negatives include no chest pain, headaches, palpitations or shortness of breath. Past treatments include angiotensin blockers, diuretics and beta blockers. Hypertensive end-organ damage includes CAD/MI.    Review of Systems  Constitutional: Positive for unexpected weight change. Negative for diaphoresis, fatigue and fever.  Respiratory: Negative for cough, chest tightness, shortness of breath and wheezing.   Cardiovascular: Negative for chest pain and palpitations.  Gastrointestinal: Negative for blood in stool and constipation.  Genitourinary: Negative for hematuria.  Musculoskeletal: Positive for arthralgias (right MCPs).  Neurological: Negative for dizziness, weakness, light-headedness and headaches.  Psychiatric/Behavioral: Negative for dysphoric mood and sleep disturbance. The patient is not nervous/anxious.     Patient Active Problem List   Diagnosis Date Noted  . Tubular adenoma of colon 07/16/2018  . 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 Coronary artery disease involving native coronary artery of native heart without angina pectoris 08/07/2014  . Hyperlipidemia 07/25/2014  . Hypertension 07/25/2014  . Hypothyroidism 07/25/2014  . Palpitations 07/25/2014    Allergies  Allergen Reactions  . Septra [Sulfamethoxazole-Trimethoprim]     Blistered on penis  . Tetracyclines & Related Rash    Blister of skin    Past Surgical History:  Procedure Laterality Date  . COLONOSCOPY  08/2016   Physcian requested to do again in 3 years. Polyps were found.  . CORONARY ANGIOPLASTY WITH STENT PLACEMENT  07/26/2014   stent placement LAD  . THYROID SURGERY  2008   partial thyroidectomy for benign mass    Social History   Tobacco Use  . Smoking status: Never  Smoker  . Smokeless tobacco: Never Used  Substance Use Topics  . Alcohol use: Yes    Alcohol/week: 1.0 standard drinks    Types: 1 Cans of beer per week    Comment: occassionally  . Drug use: No     Medication list has been reviewed and updated.  Current Meds  Medication Sig  . aspirin EC 81 MG tablet Take 81 mg by mouth.  Marland Kitchen atorvastatin (LIPITOR) 80 MG tablet Take 1 tablet (80 mg total) by mouth daily.  . metoprolol succinate (TOPROL-XL) 50 MG 24 hr tablet Take 50 mg by mouth daily. Take with or immediately following a meal.  . prasugrel (EFFIENT) 10 MG TABS tablet Take 10 mg by mouth daily.  Marland Kitchen telmisartan-hydrochlorothiazide (MICARDIS HCT) 80-25 MG tablet 1 BY MOUTH DAILY    PHQ 2/9 Scores 01/17/2019 07/16/2018 04/24/2017  PHQ - 2 Score 0 0 0    BP Readings from Last 3 Encounters:  01/17/19 130/78  07/16/18 118/86  04/08/18 (!) 155/91    Physical Exam Vitals signs and nursing note reviewed.  Constitutional:      General: He is not in acute distress.    Appearance: He is well-developed.  HENT:     Head: Normocephalic and atraumatic.     Mouth/Throat:     Mouth: Mucous membranes are moist.  Eyes:     Pupils: Pupils are equal, round, and reactive to light.  Neck:     Musculoskeletal: Normal range of motion and neck supple.  Cardiovascular:     Rate and Rhythm: Normal rate and regular rhythm.     Pulses: Normal pulses.  Pulmonary:     Effort: Pulmonary effort  is normal. No respiratory distress.     Breath sounds: Normal breath sounds. No wheezing or rhonchi.  Musculoskeletal: Normal range of motion.     Right lower leg: No edema.     Left lower leg: No edema.  Skin:    General: Skin is warm and dry.     Findings: No rash.  Neurological:     Mental Status: He is alert and oriented to person, place, and time.  Psychiatric:        Behavior: Behavior normal.        Thought Content: Thought content normal.     Wt Readings from Last 3 Encounters:  01/17/19 249  lb 3.2 oz (113 kg)  07/16/18 239 lb (108.4 kg)  04/08/18 226 lb (102.5 kg)    BP 130/78   Pulse 69   Ht 5\' 8"  (1.727 m)   Wt 249 lb 3.2 oz (113 kg)   SpO2 96%   BMI 37.89 kg/m   Assessment and Plan: 1. Essential hypertension controlled - telmisartan-hydrochlorothiazide (MICARDIS HCT) 80-25 MG tablet; 1 BY MOUTH DAILY  Dispense: 90 tablet; Refill: 3  2. Mixed hyperlipidemia Continue statin therapy - atorvastatin (LIPITOR) 80 MG tablet; Take 1 tablet (80 mg total) by mouth daily.  Dispense: 90 tablet; Refill: 3  3. Need for shingles vaccine First dose today - Varicella-zoster vaccine IM   Partially dictated using Editor, commissioning. Any errors are unintentional.  Halina Maidens, MD [facility]  01/17/2019

## 2019-01-18 ENCOUNTER — Telehealth: Payer: Self-pay

## 2019-01-18 NOTE — Telephone Encounter (Signed)
Patient called saying he received his Shingrix Vaccine yesterday and he is now having body aches, and low grade temp of 99.6 with a slight headache. Just wanted to be sure this can be side effects of the vaccine and he did not have sxs of [place].  Called patient back and informed him these can all be normal sxs after having any vaccine. Told him they should begin to go away in the next few days.

## 2019-01-22 ENCOUNTER — Other Ambulatory Visit: Payer: Self-pay | Admitting: Internal Medicine

## 2019-01-22 DIAGNOSIS — E782 Mixed hyperlipidemia: Secondary | ICD-10-CM

## 2019-01-22 DIAGNOSIS — I10 Essential (primary) hypertension: Secondary | ICD-10-CM

## 2019-04-06 ENCOUNTER — Encounter: Payer: 59 | Admitting: Internal Medicine

## 2019-04-19 ENCOUNTER — Ambulatory Visit: Payer: 59 | Admitting: Internal Medicine

## 2019-04-21 ENCOUNTER — Other Ambulatory Visit: Payer: Self-pay

## 2019-04-21 ENCOUNTER — Ambulatory Visit (INDEPENDENT_AMBULATORY_CARE_PROVIDER_SITE_OTHER): Payer: 59 | Admitting: Internal Medicine

## 2019-04-21 ENCOUNTER — Encounter: Payer: Self-pay | Admitting: Internal Medicine

## 2019-04-21 VITALS — BP 128/78 | HR 68 | Ht 68.0 in | Wt 248.0 lb

## 2019-04-21 DIAGNOSIS — I10 Essential (primary) hypertension: Secondary | ICD-10-CM

## 2019-04-21 DIAGNOSIS — Z23 Encounter for immunization: Secondary | ICD-10-CM | POA: Diagnosis not present

## 2019-04-21 NOTE — Progress Notes (Signed)
Date:  04/21/2019   Name:  Jepson Gazzola [facility].   DOB:  November 13, 1960   MRN:  [id number]   Chief Complaint: Employment Physical (Forms brought today by patient. ) Pt has biometric form for work.  He is feeling well with no problems. He is here to get his second Shringrix vaccine today.  Hypertension This is a chronic problem. The problem is controlled (does not check at home). Pertinent negatives include no chest pain, headaches, orthopnea, palpitations, peripheral edema, PND or shortness of breath. Past treatments include angiotensin blockers, diuretics and beta blockers. The current treatment provides significant improvement.   Lab Results  Component Value Date   CHOL 150 07/16/2018   HDL 50 07/16/2018   LDLCALC 78 07/16/2018   TRIG 109 07/16/2018   CHOLHDL 3.0 07/16/2018    Review of Systems  Constitutional: Negative for chills, fatigue and fever.  Respiratory: Negative for cough, chest tightness, shortness of breath and wheezing.   Cardiovascular: Negative for chest pain, palpitations, orthopnea and PND.  Neurological: Negative for dizziness and headaches.  Psychiatric/Behavioral: Negative for dysphoric mood and sleep disturbance.    Patient Active Problem List   Diagnosis Date Noted  . Tubular adenoma of colon 07/16/2018  . 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 Coronary artery disease involving native coronary artery of native heart without angina pectoris 08/07/2014  . Hyperlipidemia 07/25/2014  . Hypertension 07/25/2014  . Hypothyroidism 07/25/2014  . Palpitations 07/25/2014    Allergies  Allergen Reactions  . Septra [Sulfamethoxazole-Trimethoprim]     Blistered on penis  . Tetracyclines & Related Rash    Blister of skin    Past Surgical History:  Procedure Laterality Date  . COLONOSCOPY  08/2016   Physcian requested to do again in 3 years. Polyps were found.  . CORONARY ANGIOPLASTY WITH STENT PLACEMENT  07/26/2014   stent placement LAD  . THYROID SURGERY   2008   partial thyroidectomy for benign mass    Social History   Tobacco Use  . Smoking status: Never Smoker  . Smokeless tobacco: Never Used  Substance Use Topics  . Alcohol use: Yes    Alcohol/week: 1.0 standard drinks    Types: 1 Cans of beer per week    Comment: occassionally  . Drug use: No     Medication list has been reviewed and updated.  Current Meds  Medication Sig  . aspirin EC 81 MG tablet Take 81 mg by mouth.  Marland Kitchen atorvastatin (LIPITOR) 80 MG tablet TAKE 1 TABLET DAILY  . metoprolol succinate (TOPROL-XL) 50 MG 24 hr tablet Take 50 mg by mouth daily. Take with or immediately following a meal.  . prasugrel (EFFIENT) 10 MG TABS tablet Take 10 mg by mouth daily.  Marland Kitchen telmisartan-hydrochlorothiazide (MICARDIS HCT) 80-25 MG tablet TAKE 1 TABLET DAILY    PHQ 2/9 Scores 04/21/2019 01/17/2019 07/16/2018 04/24/2017  PHQ - 2 Score 0 0 0 0    BP Readings from Last 3 Encounters:  04/21/19 128/78  01/17/19 130/78  07/16/18 118/86    Physical Exam Vitals signs and nursing note reviewed.  Constitutional:      General: He is not in acute distress.    Appearance: He is well-developed.  HENT:     Head: Normocephalic and atraumatic.  Cardiovascular:     Rate and Rhythm: Normal rate and regular rhythm.     Pulses: Normal pulses.     Heart sounds: No murmur.  Pulmonary:     Effort: Pulmonary effort is normal.  No respiratory distress.  Musculoskeletal: Normal range of motion.     Right lower leg: No edema.     Left lower leg: No edema.  Skin:    General: Skin is warm and dry.     Capillary Refill: Capillary refill takes less than 2 seconds.     Findings: No rash.  Neurological:     General: No focal deficit present.     Mental Status: He is alert and oriented to person, place, and time.  Psychiatric:        Behavior: Behavior normal.        Thought Content: Thought content normal.     Wt Readings from Last 3 Encounters:  04/21/19 248 lb (112.5 kg)  01/17/19 249 lb  3.2 oz (113 kg)  07/16/18 239 lb (108.4 kg)    BP 128/78   Pulse 68   Ht 5\' 8"  (1.727 m)   Wt 248 lb (112.5 kg)   SpO2 96%   BMI 37.71 kg/m   Assessment and Plan: 1. Essential hypertension Clinically stable exam with well controlled BP.   Tolerating medications, telmisartan hct and metoprolol, without side effects at this time. Pt to continue current regimen and low sodium diet; benefits of regular exercise as able discussed. Form insurance premium reduction completed  2. Need for shingles vaccine Second dose given today   Partially dictated using Editor, commissioning. Any errors are unintentional.  Halina Maidens, MD [facility]  04/21/2019

## 2019-04-25 ENCOUNTER — Encounter: Payer: 59 | Admitting: Internal Medicine

## 2019-07-26 ENCOUNTER — Encounter: Payer: Self-pay | Admitting: Internal Medicine

## 2019-07-26 ENCOUNTER — Other Ambulatory Visit: Payer: Self-pay

## 2019-07-26 ENCOUNTER — Ambulatory Visit (INDEPENDENT_AMBULATORY_CARE_PROVIDER_SITE_OTHER): Payer: 59 | Admitting: Internal Medicine

## 2019-07-26 VITALS — BP 132/82 | HR 65 | Ht 68.0 in | Wt 246.0 lb

## 2019-07-26 DIAGNOSIS — E890 Postprocedural hypothyroidism: Secondary | ICD-10-CM

## 2019-07-26 DIAGNOSIS — Z0000 Encounter for general adult medical examination without abnormal findings: Secondary | ICD-10-CM

## 2019-07-26 DIAGNOSIS — E782 Mixed hyperlipidemia: Secondary | ICD-10-CM

## 2019-07-26 DIAGNOSIS — Z1211 Encounter for screening for malignant neoplasm of colon: Secondary | ICD-10-CM | POA: Diagnosis not present

## 2019-07-26 DIAGNOSIS — I2510 Atherosclerotic heart disease of native coronary artery without angina pectoris: Secondary | ICD-10-CM

## 2019-07-26 DIAGNOSIS — I10 Essential (primary) hypertension: Secondary | ICD-10-CM

## 2019-07-26 DIAGNOSIS — J0100 Acute maxillary sinusitis, unspecified: Secondary | ICD-10-CM

## 2019-07-26 DIAGNOSIS — Z125 Encounter for screening for malignant neoplasm of prostate: Secondary | ICD-10-CM

## 2019-07-26 LAB — POCT URINALYSIS DIPSTICK
Bilirubin, UA: NEGATIVE
Blood, UA: NEGATIVE
Glucose, UA: NEGATIVE
Ketones, UA: NEGATIVE
Leukocytes, UA: NEGATIVE
Nitrite, UA: NEGATIVE
Protein, UA: POSITIVE — AB
Spec Grav, UA: 1.02 (ref 1.010–1.025)
Urobilinogen, UA: 0.2 E.U./dL
pH, UA: 5 (ref 5.0–8.0)

## 2019-07-26 MED ORDER — AMOXICILLIN-POT CLAVULANATE 875-125 MG PO TABS
1.0000 | ORAL_TABLET | Freq: Two times a day (BID) | ORAL | 0 refills | Status: AC
Start: 2019-07-26 — End: 2019-08-05

## 2019-07-26 NOTE — Progress Notes (Signed)
Date:  07/26/2019   Name:  Algia Trapasso [facility].   DOB:  [birth year]   MRN:  [id number]   Chief Complaint: No chief complaint on file. Alycia Rossetti Marisol Linkenhoker. is a 58 y.o. male who presents today for his Complete Annual Exam. He feels fairly well. He reports exercising none. He reports he is sleeping fairly well. He has gained some weight and knows that he needs to work on diet and exercise.  He declines flu vaccine.  Colonoscopy - scheduled for January at [place] History  Administered Date(s) Administered  . Tdap 07/16/2018  . Zoster Recombinat (Shingrix) 01/17/2019, 04/21/2019    Hypertension This is a chronic problem. The problem is controlled (has not checked at home). Pertinent negatives include no chest pain, palpitations or shortness of breath. Past treatments include angiotensin blockers, diuretics and beta blockers. The current treatment provides significant improvement. There are no compliance problems.  Hypertensive end-organ damage includes CAD/MI.  Hyperlipidemia The problem is controlled. Pertinent negatives include no chest pain or shortness of breath. Current antihyperlipidemic treatment includes statins. The current treatment provides significant improvement of lipids. There are no compliance problems.   Sinusitis This is a new problem. The current episode started in the past 7 days. There has been no fever. He is experiencing no pain. Associated symptoms include congestion, coughing and sinus pressure. Pertinent negatives include no chills, diaphoresis or shortness of breath. Past treatments include acetaminophen and saline sprays. The treatment provided mild relief.  CAD - s/p stent, on statin and dual anti-platelet therapy.  Doing well, physically active with no chest pain, SOB. Last seen by cardiology last week.  Lab Results  Component Value Date   CREATININE 0.98 07/16/2018   BUN 15 07/16/2018   NA 139 07/16/2018   K 3.9 07/16/2018   CL 99 07/16/2018   CO2 24 07/16/2018   Lab Results  Component Value Date   CHOL 150 07/16/2018   HDL 50 07/16/2018   LDLCALC 78 07/16/2018   TRIG 109 07/16/2018   CHOLHDL 3.0 07/16/2018   Lab Results  Component Value Date   TSH 2.170 07/16/2018   Lab Results  Component Value Date   HGBA1C 5.8 (H) 07/16/2018     Review of Systems  Constitutional: Negative for chills, diaphoresis, fatigue and fever.  HENT: Positive for congestion and sinus pressure.   Respiratory: Positive for cough. Negative for chest tightness, shortness of breath and wheezing.   Cardiovascular: Negative for chest pain, palpitations and leg swelling.  Gastrointestinal: Negative for abdominal pain, constipation and diarrhea.  Musculoskeletal: Negative for arthralgias.  Hematological: Negative for adenopathy.  Psychiatric/Behavioral: Negative for dysphoric mood and sleep disturbance. The patient is not nervous/anxious.     Patient Active Problem List   Diagnosis Date Noted  . Tubular adenoma of colon 07/16/2018  . 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 Coronary artery disease involving native coronary artery of native heart without angina pectoris 08/07/2014  . Mixed hyperlipidemia 07/25/2014  . Essential hypertension 07/25/2014  . Palpitations 07/25/2014    Allergies  Allergen Reactions  . Septra [Sulfamethoxazole-Trimethoprim]     Blistered on penis  . Tetracyclines & Related Rash    Blister of skin    Past Surgical History:  Procedure Laterality Date  . COLONOSCOPY  08/2016   Physcian requested to do again in 3 years. Polyps were found.  . CORONARY ANGIOPLASTY WITH STENT PLACEMENT  07/26/2014   stent placement LAD  . THYROID SURGERY  2008   partial  thyroidectomy for benign mass    Social History   Tobacco Use  . Smoking status: Never Smoker  . Smokeless tobacco: Never Used  Substance Use Topics  . Alcohol use: Yes    Alcohol/week: 1.0 standard drinks    Types: 1 Cans of beer per week    Comment:  occassionally  . Drug use: No     Medication list has been reviewed and updated.  Current Meds  Medication Sig  . aspirin EC 81 MG tablet Take 81 mg by mouth.  Marland Kitchen atorvastatin (LIPITOR) 80 MG tablet TAKE 1 TABLET DAILY  . metoprolol succinate (TOPROL-XL) 50 MG 24 hr tablet Take 50 mg by mouth daily. Take with or immediately following a meal.  . Multiple Vitamin (ONE DAILY) tablet Take by mouth.  . prasugrel (EFFIENT) 10 MG TABS tablet Take 10 mg by mouth daily.  Marland Kitchen telmisartan-hydrochlorothiazide (MICARDIS HCT) 80-25 MG tablet TAKE 1 TABLET DAILY    PHQ 2/9 Scores 07/26/2019 04/21/2019 01/17/2019 07/16/2018  PHQ - 2 Score 0 0 0 0  PHQ- 9 Score 0 - - -    BP Readings from Last 3 Encounters:  07/26/19 132/82  04/21/19 128/78  01/17/19 130/78    Physical Exam Vitals signs and nursing note reviewed.  Constitutional:      Appearance: Normal appearance. He is well-developed.  HENT:     Head: Normocephalic.     Right Ear: Tympanic membrane, ear canal and external ear normal.     Left Ear: Tympanic membrane, ear canal and external ear normal.     Nose:     Right Sinus: Maxillary sinus tenderness present.     Left Sinus: Maxillary sinus tenderness present.  Eyes:     Conjunctiva/sclera: Conjunctivae normal.     Pupils: Pupils are equal, round, and reactive to light.  Neck:     Musculoskeletal: Normal range of motion and neck supple.     Thyroid: No thyromegaly.     Vascular: No carotid bruit.  Cardiovascular:     Rate and Rhythm: Normal rate and regular rhythm.     Pulses: Normal pulses.     Heart sounds: Normal heart sounds. No murmur.  Pulmonary:     Effort: Pulmonary effort is normal.     Breath sounds: Normal breath sounds. No wheezing.  Chest:     Breasts:        Right: No mass.        Left: No mass.  Abdominal:     General: Bowel sounds are normal.     Palpations: Abdomen is soft.     Tenderness: There is no abdominal tenderness.  Musculoskeletal: Normal range of  motion.     Right lower leg: No edema.     Left lower leg: No edema.  Lymphadenopathy:     Cervical: No cervical adenopathy.  Skin:    General: Skin is warm and dry.     Capillary Refill: Capillary refill takes less than 2 seconds.  Neurological:     Mental Status: He is alert and oriented to person, place, and time.     Deep Tendon Reflexes: Reflexes are normal and symmetric.  Psychiatric:        Attention and Perception: Attention normal.        Speech: Speech normal.        Behavior: Behavior normal.        Thought Content: Thought content normal.        Judgment: Judgment normal.  Wt Readings from Last 3 Encounters:  07/26/19 246 lb (111.6 kg)  04/21/19 248 lb (112.5 kg)  01/17/19 249 lb 3.2 oz (113 kg)    BP 132/82   Pulse 65   Ht 5\' 8"  (1.727 m)   Wt 246 lb (111.6 kg)   SpO2 96%   BMI 37.40 kg/m   Assessment and Plan: 1. Annual physical exam Normal exam except for weight Begin regular exercise, diet changes - POCT urinalysis dipstick  2. Colon cancer screening Scheduled in January at [place]. Essential hypertension Clinically stable exam with well controlled BP.   Tolerating medications, metoprolol and telmisartan hct, without side effects at this time. Pt to continue current regimen and low sodium diet; benefits of regular exercise as able discussed. - CBC with Differential/Platelet - Comprehensive metabolic panel  4. Mixed hyperlipidemia Tolerating statin medication without side effects at this time Continue same therapy without change at this time. - Lipid panel  5. Coronary artery disease involving native coronary artery of native heart without angina pectoris Stable without symptoms at this time Tolerating dual anti-platelet therapy and statins Begin regular exercise  6. Prostate cancer screening DRE deferred - PSA  7. Postoperative hypothyroidism Supplements stopped several years ago with normal labs Can resolve issue if values normal  today - TSH + free T4  8. Acute non-recurrent maxillary sinusitis Continue otc decongestants and saline spray - amoxicillin-clavulanate (AUGMENTIN) 875-125 MG tablet; Take 1 tablet by mouth 2 (two) times daily for 10 days.  Dispense: 20 tablet; Refill: 0   Partially dictated using Editor, commissioning. Any errors are unintentional.  Halina Maidens, MD [facility]  07/26/2019

## 2019-07-27 ENCOUNTER — Encounter: Payer: Self-pay | Admitting: Internal Medicine

## 2019-07-27 LAB — COMPREHENSIVE METABOLIC PANEL
ALT: 33 IU/L (ref 0–44)
AST: 33 IU/L (ref 0–40)
Albumin/Globulin Ratio: 1.8 (ref 1.2–2.2)
Albumin: 4.4 g/dL (ref 3.8–4.9)
Alkaline Phosphatase: 113 IU/L (ref 39–117)
BUN/Creatinine Ratio: 14 (ref 9–20)
BUN: 14 mg/dL (ref 6–24)
Bilirubin Total: 0.4 mg/dL (ref 0.0–1.2)
CO2: 27 mmol/L (ref 20–29)
Calcium: 9.5 mg/dL (ref 8.7–10.2)
Chloride: 99 mmol/L (ref 96–106)
Creatinine, Ser: 1.02 mg/dL (ref 0.76–1.27)
GFR calc Af Amer: 93 mL/min/{1.73_m2} (ref >59)
GFR calc non Af Amer: 80 mL/min/{1.73_m2} (ref >59)
Globulin, Total: 2.5 g/dL (ref 1.5–4.5)
Glucose: 88 mg/dL (ref 65–99)
Potassium: 4.1 mmol/L (ref 3.5–5.2)
Sodium: 141 mmol/L (ref 134–144)
Total Protein: 6.9 g/dL (ref 6.0–8.5)

## 2019-07-27 LAB — CBC WITH DIFFERENTIAL/PLATELET
Basophils Absolute: 0.1 10*3/uL (ref 0.0–0.2)
Basos: 1 %
EOS (ABSOLUTE): 0.2 10*3/uL (ref 0.0–0.4)
Eos: 2 %
Hematocrit: 43 % (ref 37.5–51.0)
Hemoglobin: 14.1 g/dL (ref 13.0–17.7)
Immature Grans (Abs): 0 10*3/uL (ref 0.0–0.1)
Immature Granulocytes: 1 %
Lymphocytes Absolute: 2.8 10*3/uL (ref 0.7–3.1)
Lymphs: 35 %
MCH: 26.9 pg (ref 26.6–33.0)
MCHC: 32.8 g/dL (ref 31.5–35.7)
MCV: 82 fL (ref 79–97)
Monocytes Absolute: 0.9 10*3/uL (ref 0.1–0.9)
Monocytes: 11 %
Neutrophils Absolute: 4.1 10*3/uL (ref 1.4–7.0)
Neutrophils: 50 %
Platelets: 187 10*3/uL (ref 150–450)
RBC: 5.24 x10E6/uL (ref 4.14–5.80)
RDW: 13.2 % (ref 11.6–15.4)
WBC: 8.1 10*3/uL (ref 3.4–10.8)

## 2019-07-27 LAB — LIPID PANEL
Chol/HDL Ratio: 4 ratio (ref 0.0–5.0)
Cholesterol, Total: 160 mg/dL (ref 100–199)
HDL: 40 mg/dL (ref >39)
LDL Chol Calc (NIH): 95 mg/dL (ref 0–99)
Triglycerides: 142 mg/dL (ref 0–149)
VLDL Cholesterol Cal: 25 mg/dL (ref 5–40)

## 2019-07-27 LAB — PSA: Prostate Specific Ag, Serum: 0.8 ng/mL (ref 0.0–4.0)

## 2019-07-27 LAB — TSH+FREE T4
Free T4: 1.11 ng/dL (ref 0.82–1.77)
TSH: 2.12 u[IU]/mL (ref 0.450–4.500)

## 2019-09-19 LAB — HM COLONOSCOPY

## 2019-12-12 ENCOUNTER — Encounter: Payer: Self-pay | Admitting: Internal Medicine

## 2019-12-12 ENCOUNTER — Ambulatory Visit (INDEPENDENT_AMBULATORY_CARE_PROVIDER_SITE_OTHER): Payer: 59 | Admitting: Internal Medicine

## 2019-12-12 ENCOUNTER — Other Ambulatory Visit: Payer: Self-pay

## 2019-12-12 VITALS — BP 136/82 | HR 74 | Temp 97.7°F | Ht 68.0 in | Wt 236.0 lb

## 2019-12-12 DIAGNOSIS — N5089 Other specified disorders of the male genital organs: Secondary | ICD-10-CM | POA: Diagnosis not present

## 2019-12-12 DIAGNOSIS — I10 Essential (primary) hypertension: Secondary | ICD-10-CM

## 2019-12-12 NOTE — Progress Notes (Signed)
Date:  12/12/2019   Name:  Richard Underwood [facility].   DOB:  [birth year]   MRN:  [id number]   Chief Complaint: Testicle Pain (Left testicle is tender and larger than the right. Had a US done ten years ago. Dull pain. )  Testicle Pain The patient's primary symptoms include scrotal swelling and testicular pain. The patient's pertinent negatives include no penile pain. This is a chronic problem. The current episode started more than 1 year ago. The problem occurs daily. The problem has been unchanged. The pain is mild (ache). Pertinent negatives include no chest pain, dysuria, fever, frequency, headaches or shortness of breath.    Lab Results  Component Value Date   CREATININE 1.02 07/26/2019   BUN 14 07/26/2019   NA 141 07/26/2019   K 4.1 07/26/2019   CL 99 07/26/2019   CO2 27 07/26/2019   Lab Results  Component Value Date   CHOL 160 07/26/2019   HDL 40 07/26/2019   LDLCALC 95 07/26/2019   TRIG 142 07/26/2019   CHOLHDL 4.0 07/26/2019   Lab Results  Component Value Date   TSH 2.120 07/26/2019   Lab Results  Component Value Date   HGBA1C 5.8 (H) 07/16/2018   Lab Results  Component Value Date   WBC 8.1 07/26/2019   HGB 14.1 07/26/2019   HCT 43.0 07/26/2019   MCV 82 07/26/2019   PLT 187 07/26/2019   Lab Results  Component Value Date   ALT 33 07/26/2019   AST 33 07/26/2019   ALKPHOS 113 07/26/2019   BILITOT 0.4 07/26/2019     Review of Systems  Constitutional: Positive for unexpected weight change (has lost 10 lbs with exercise). Negative for diaphoresis and fever.  Respiratory: Negative for chest tightness and shortness of breath.   Cardiovascular: Negative for chest pain and palpitations.  Genitourinary: Positive for scrotal swelling and testicular pain. Negative for dysuria, frequency, genital sores, hematuria and penile pain.  Neurological: Negative for dizziness and headaches.    Patient Active Problem List   Diagnosis Date Noted  . Tubular adenoma of colon  07/16/2018  . 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 Coronary artery disease involving native coronary artery of native heart without angina pectoris 08/07/2014  . Mixed hyperlipidemia 07/25/2014  . Essential hypertension 07/25/2014  . Palpitations 07/25/2014    Allergies  Allergen Reactions  . Septra [Sulfamethoxazole-Trimethoprim]     Blistered on penis  . Tetracyclines & Related Rash    Blister of skin    Past Surgical History:  Procedure Laterality Date  . COLONOSCOPY  08/2016   Physcian requested to do again in 3 years. Polyps were found.  . CORONARY ANGIOPLASTY WITH STENT PLACEMENT  07/26/2014   stent placement LAD  . THYROID SURGERY  2008   partial thyroidectomy for benign mass    Social History   Tobacco Use  . Smoking status: Never Smoker  . Smokeless tobacco: Never Used  Substance Use Topics  . Alcohol use: Yes    Alcohol/week: 1.0 standard drinks    Types: 1 Cans of beer per week    Comment: occassionally  . Drug use: No     Medication list has been reviewed and updated.  Current Meds  Medication Sig  . aspirin EC 81 MG tablet Take 81 mg by mouth.  Marland Kitchen atorvastatin (LIPITOR) 80 MG tablet TAKE 1 TABLET DAILY  . metoprolol succinate (TOPROL-XL) 50 MG 24 hr tablet Take 50 mg by mouth daily. Take with or immediately following  a meal.  . Multiple Vitamin (ONE DAILY) tablet Take by mouth.  . prasugrel (EFFIENT) 10 MG TABS tablet Take 10 mg by mouth daily.  Marland Kitchen telmisartan-hydrochlorothiazide (MICARDIS HCT) 80-25 MG tablet TAKE 1 TABLET DAILY    PHQ 2/9 Scores 12/12/2019 07/26/2019 04/21/2019 01/17/2019  PHQ - 2 Score 0 0 0 0  PHQ- 9 Score 0 0 - -    BP Readings from Last 3 Encounters:  12/12/19 136/82  07/26/19 132/82  04/21/19 128/78    Physical Exam Vitals and nursing note reviewed.  Constitutional:      General: He is not in acute distress.    Appearance: He is well-developed.  HENT:     Head: Normocephalic and atraumatic.  Cardiovascular:     Rate  and Rhythm: Normal rate and regular rhythm.  Pulmonary:     Effort: Pulmonary effort is normal. No respiratory distress.  Abdominal:     Hernia: There is no hernia in the left inguinal area or right inguinal area.  Genitourinary:    Penis: Normal and uncircumcised. No discharge.      Testes:        Right: Mass, tenderness or swelling not present.        Left: Mass and swelling present. Tenderness not present.     Epididymis:     Right: Normal.     Left: Normal.     Comments: Left testicle twice the size of the right No discrete mass is appreciated - pt has hx of varicocele noted about 10 yrs ago Musculoskeletal:        General: Normal range of motion.  Skin:    General: Skin is warm and dry.     Findings: No rash.  Neurological:     Mental Status: He is alert and oriented to person, place, and time.  Psychiatric:        Behavior: Behavior normal.        Thought Content: Thought content normal.     Wt Readings from Last 3 Encounters:  12/12/19 236 lb (107 kg)  07/26/19 246 lb (111.6 kg)  04/21/19 248 lb (112.5 kg)    BP 136/82   Pulse 74   Temp 97.7 F (36.5 C) (Temporal)   Ht 5\' 8"  (1.727 m)   Wt 236 lb (107 kg)   SpO2 95%   BMI 35.88 kg/m   Assessment and Plan: 1. Mass of left testicle Refer for [place] Take tylenol tid for discomfort/inflamation - US Scrotum; Future  2. Essential hypertension Clinically stable exam with well controlled BP. Tolerating medications without side effects at this time. Pt to continue current regimen and low sodium diet; benefits of regular exercise as able discussed.   Partially dictated using Editor, commissioning. Any errors are unintentional.  Halina Maidens, MD [facility]  12/12/2019

## 2019-12-15 ENCOUNTER — Ambulatory Visit
Admission: RE | Admit: 2019-12-15 | Discharge: 2019-12-15 | Disposition: A | Discharge status: Home / Self Care | Payer: 59 | Source: Ambulatory Visit | Attending: Internal Medicine | Admitting: Internal Medicine

## 2019-12-15 ENCOUNTER — Other Ambulatory Visit: Payer: Self-pay

## 2019-12-15 ENCOUNTER — Other Ambulatory Visit: Payer: Self-pay | Admitting: Internal Medicine

## 2019-12-15 DIAGNOSIS — N5089 Other specified disorders of the male genital organs: Secondary | ICD-10-CM

## 2019-12-20 ENCOUNTER — Other Ambulatory Visit: Payer: Self-pay

## 2019-12-20 ENCOUNTER — Ambulatory Visit (INDEPENDENT_AMBULATORY_CARE_PROVIDER_SITE_OTHER): Payer: 59 | Admitting: Urology

## 2019-12-20 ENCOUNTER — Encounter: Payer: Self-pay | Admitting: Urology

## 2019-12-20 VITALS — BP 142/83 | HR 66 | Ht 68.0 in | Wt 227.0 lb

## 2019-12-20 DIAGNOSIS — N503 Cyst of epididymis: Secondary | ICD-10-CM | POA: Diagnosis not present

## 2019-12-20 NOTE — Progress Notes (Signed)
12/20/19 1:49 PM   [name] April 15, 1961 [id number]  CC: Left scrotal mass  HPI: I saw Mr. Klahn in urology clinic today for evaluation of the left scrotal mass.  He is a 59 year old male with cardiac history status post cardiac stent placement who remains on anticoagulation with prasugrel. He reports >5 year history of right scrotal swelling and palpable mass. This was originally evaluated with a scrotal ultrasound around 2013 that reportedly showed an epididymal cyst. These records are not currently available to me. He feels this lesion has increased in size over the last few months and has become more bulky and bothersome.  A repeat scrotal ultrasound was performed in April 2021 and showed a 6 cm cystic lesion in the region of the left epididymal head.  He denies any urinary symptoms, gross hematuria, weight loss, or systemic symptoms. No prior urologic surgeries or hernia procedures.  He is interested in removal of this lesion as it has become painful and uncomfortable when riding a bike or exercising.  He denies any weight loss.  PMH: Past Medical History:  Diagnosis Date  . Heart disease   . Hypercholesteremia   . Hypertension   . Hypothyroidism 07/25/2014  . Thyroid disease     Surgical History: Past Surgical History:  Procedure Laterality Date  . COLONOSCOPY  08/2016   Physcian requested to do again in 3 years. Polyps were found.  . CORONARY ANGIOPLASTY WITH STENT PLACEMENT  07/26/2014   stent placement LAD  . THYROID SURGERY  2008   partial thyroidectomy for benign mass    Family History: Family History  Problem Relation Age of Onset  . Diabetes Mother   . Hypertension Mother   . Diabetes Maternal Grandmother   . Diabetes Maternal Grandfather     Social History:  reports that he has never smoked. He has never used smokeless tobacco. He reports current alcohol use of about 1.0 standard drinks of alcohol per week. He reports that he does not use  drugs.  Physical Exam: BP (!) 142/83 (BP Location: Left Arm, Patient Position: Sitting, Cuff Size: Normal)   Pulse 66   Ht 5\' 8"  (1.727 m)   Wt 227 lb (103 kg)   BMI 34.52 kg/m    Constitutional:  Alert and oriented, No acute distress. Cardiovascular: No clubbing, cyanosis, or edema. Respiratory: Normal respiratory effort, no increased work of breathing. GI: Abdomen is soft, nontender, nondistended, no abdominal masses GU: Widely patent meatus.  Testicles descended bilaterally with no masses.  4 cm cystic feeling lesion in the left upper scrotum consistent with ultrasound findings, suspect epididymal cyst versus spermatocele Lymph: No cervical or inguinal lymphadenopathy. Skin: No rashes, bruises or suspicious lesions. Neurologic: Grossly intact, no focal deficits, moving all 4 extremities. Psychiatric: Normal mood and affect.  Pertinent Imaging: I have personally reviewed the scrotal ultrasound, appears consistent with an epididymal cyst or spermatocele.  Assessment & Plan:   In summary, he is a 59 year old male with cardiac history on anticoagulation who presents with enlarging left scrotal mass most consistent with an epididymal cyst/spermatocele.  I reviewed the imaging with him today.  He reportedly had a scrotal ultrasound with similar findings when the lesion was smaller around 8 years ago.  We will obtain these prior images.  We discussed the very low risk of malignancy with such as cystic-appearing lesion, location outside the testicle, and duration of more than 5 years.  We discussed treatment options including observation versus spermatocelectomy with either a scrotal  or inguinal approach.  If his prior imaging findings from 2013 are similar to now, I feel a scrotal approach is appropriate.  We discussed the risks of bleeding, infection, recurrence, scrotal pain, and testicular atrophy.  Obtain outside scrotal ultrasound from 2013 Schedule left spermatocelectomy We will need  cardiac clearance and to hold anticoagulation  Nickolas Madrid, MD 12/20/2019  [place] [address], [place],  [address] [phone]

## 2019-12-21 ENCOUNTER — Other Ambulatory Visit: Payer: Self-pay | Admitting: Urology

## 2019-12-21 DIAGNOSIS — N4340 Spermatocele of epididymis, unspecified: Secondary | ICD-10-CM

## 2019-12-30 ENCOUNTER — Encounter
Admission: RE | Admit: 2019-12-30 | Discharge: 2019-12-30 | Disposition: A | Discharge status: Home / Self Care | Payer: 59 | Source: Ambulatory Visit | Attending: Urology | Admitting: Urology

## 2019-12-30 ENCOUNTER — Other Ambulatory Visit: Payer: Self-pay

## 2019-12-30 HISTORY — DX: Family history of other specified conditions: Z84.89

## 2019-12-30 NOTE — Patient Instructions (Addendum)
Your procedure is scheduled on: 01/06/20 Report to [place]. To find out your arrival time please call [phone] between 1PM - 3PM on 01/05/20.  Remember: Instructions that are not followed completely may result in serious medical risk, up to and including death, or upon the discretion of your surgeon and anesthesiologist your surgery may need to be rescheduled.     _X__ 1. Do not eat food after midnight the night before your procedure.                 No gum chewing or hard candies. You may drink clear liquids up to 2 hours                 before you are scheduled to arrive for your surgery- DO not drink clear                 liquids within 2 hours of the start of your surgery.                 Clear Liquids include:  water, apple juice without pulp, clear carbohydrate                 drink such as Clearfast or Gatorade, Black Coffee or Tea (Do not add                 anything to coffee or tea). Diabetics water only  __X__2.  On the morning of surgery brush your teeth with toothpaste and water, you                 may rinse your mouth with mouthwash if you wish.  Do not swallow any              toothpaste of mouthwash.     _X__ 3.  No Alcohol for 24 hours before or after surgery.   _X__ 4.  Do Not Smoke or use e-cigarettes For 24 Hours Prior to Your Surgery.                 Do not use any chewable tobacco products for at least 6 hours prior to                 surgery.  ____  5.  Bring all medications with you on the day of surgery if instructed.   __X__  6.  Notify your doctor if there is any change in your medical condition      (cold, fever, infections).     Do not wear jewelry, make-up, hairpins, clips or nail polish. Do not wear lotions, powders, or perfumes.  Do not shave 48 hours prior to surgery. Men may shave face and neck. Do not bring valuables to the hospital.    [facility] is not responsible for any belongings or  valuables.  Contacts, dentures/partials or body piercings may not be worn into surgery. Bring a case for your contacts, glasses or hearing aids, a denture cup will be supplied. Leave your suitcase in the car. After surgery it may be brought to your room. For patients admitted to the hospital, discharge time is determined by your treatment team.   Patients discharged the day of surgery will not be allowed to drive home.   Please read over the following fact sheets that you were given:   MRSA Information  __X__ Take these medicines the morning of surgery with A SIP OF WATER:  1. atorvastatin (LIPITOR) 80 MG tablet  2. metoprolol succinate (TOPROL-XL) 50 MG 24 hr tablet  3.   4.  5.  6.  ____ Fleet Enema (as directed)   __X__ Use CHG Soap/SAGE wipes as directed  ____ Use inhalers on the day of surgery  ____ Stop metformin/Janumet/Farxiga 2 days prior to surgery    ____ Take 1/2 of usual insulin dose the night before surgery. No insulin the morning          of surgery.   __X__ Stop Blood Thinners Coumadin/Plavix/Xarelto/Pleta/Pradaxa/Eliquis/Effient/ AS PREVIOUSLY INSTRUCTED   Or contact your Surgeon, Cardiologist or Medical Doctor regarding  ability to stop your blood thinners  __X__ Stop Anti-inflammatories 7 days before surgery such as Advil, Ibuprofen, Motrin,  BC or Goodies Powder, Naprosyn, Naproxen, Aleve   __X__ Stop all herbal supplements, fish oil or vitamin E until after surgery.    ____ Bring C-Pap to the hospital.

## 2020-01-04 ENCOUNTER — Other Ambulatory Visit: Payer: Self-pay

## 2020-01-04 ENCOUNTER — Other Ambulatory Visit
Admission: RE | Admit: 2020-01-04 | Discharge: 2020-01-04 | Disposition: A | Discharge status: Home / Self Care | Payer: 59 | Source: Ambulatory Visit | Attending: Urology | Admitting: Urology

## 2020-01-04 DIAGNOSIS — Z20822 Contact with and (suspected) exposure to covid-19: Secondary | ICD-10-CM | POA: Insufficient documentation

## 2020-01-04 DIAGNOSIS — Z01812 Encounter for preprocedural laboratory examination: Secondary | ICD-10-CM | POA: Insufficient documentation

## 2020-01-04 LAB — CBC
HCT: 43.2 % (ref 39.0–52.0)
Hemoglobin: 13.8 g/dL (ref 13.0–17.0)
MCH: 27.1 pg (ref 26.0–34.0)
MCHC: 31.9 g/dL (ref 30.0–36.0)
MCV: 84.9 fL (ref 80.0–100.0)
Platelets: 200 10*3/uL (ref 150–400)
RBC: 5.09 MIL/uL (ref 4.22–5.81)
RDW: 13.8 % (ref 11.5–15.5)
WBC: 7.1 10*3/uL (ref 4.0–10.5)
nRBC: 0 % (ref 0.0–0.2)

## 2020-01-04 LAB — BASIC METABOLIC PANEL
Anion gap: 8 (ref 5–15)
BUN: 19 mg/dL (ref 6–20)
CO2: 30 mmol/L (ref 22–32)
Calcium: 9 mg/dL (ref 8.9–10.3)
Chloride: 99 mmol/L (ref 98–111)
Creatinine, Ser: 1.14 mg/dL (ref 0.61–1.24)
GFR calc Af Amer: >60 mL/min (ref >60)
GFR calc non Af Amer: >60 mL/min (ref >60)
Glucose, Bld: 97 mg/dL (ref 70–99)
Potassium: 3.3 mmol/L — ABNORMAL LOW (ref 3.5–5.1)
Sodium: 137 mmol/L (ref 135–145)

## 2020-01-04 LAB — SARS CORONAVIRUS 2 (TAT 6-24 HRS): SARS Coronavirus 2: NEGATIVE

## 2020-01-05 ENCOUNTER — Telehealth: Payer: Self-pay

## 2020-01-05 DIAGNOSIS — E876 Hypokalemia: Secondary | ICD-10-CM

## 2020-01-05 MED ORDER — CEFAZOLIN SODIUM-DEXTROSE 2-4 GM/100ML-% IV SOLN
2.0000 g | INTRAVENOUS | Status: AC
  Administered 2020-01-06: 2 g via INTRAVENOUS

## 2020-01-05 MED ORDER — POTASSIUM CITRATE ER 15 MEQ (1620 MG) PO TBCR: EXTENDED_RELEASE_TABLET | ORAL | 0 refills | Status: DC

## 2020-01-05 NOTE — Telephone Encounter (Signed)
-----   Message from Billey Co, MD sent at 01/05/2020  8:18 AM EDT ----- Regarding: k+ supplementation Please send potassium citrate to pharmacy today, should take 2 doses today of 58mEq ea dose, thanks  Nickolas Madrid, MD 01/05/2020  ----- Message ----- From: Gladstone Lighter Sent: 01/04/2020   2:54 PM EDT To: Billey Co, MD  FYI-[facility] sent pt. Lab work over today- K+3.3.      Thanks, Denny Peon

## 2020-01-05 NOTE — Telephone Encounter (Signed)
Called pt informed him of the information below. Pt gave verbal understanding. RX sent.  

## 2020-01-06 ENCOUNTER — Encounter
Admission: RE | Disposition: A | Discharge status: Home / Self Care | Payer: Self-pay | Source: Home / Self Care | Attending: Urology

## 2020-01-06 ENCOUNTER — Encounter: Payer: Self-pay | Admitting: Urology

## 2020-01-06 ENCOUNTER — Ambulatory Visit: Payer: 59 | Admitting: Anesthesiology

## 2020-01-06 ENCOUNTER — Other Ambulatory Visit: Payer: Self-pay

## 2020-01-06 ENCOUNTER — Ambulatory Visit
Admission: RE | Admit: 2020-01-06 | Discharge: 2020-01-06 | Disposition: A | Discharge status: Home / Self Care | Payer: 59 | Attending: Urology | Admitting: Urology

## 2020-01-06 DIAGNOSIS — Z955 Presence of coronary angioplasty implant and graft: Secondary | ICD-10-CM | POA: Insufficient documentation

## 2020-01-06 DIAGNOSIS — N4341 Spermatocele of epididymis, single: Secondary | ICD-10-CM | POA: Diagnosis present

## 2020-01-06 DIAGNOSIS — I10 Essential (primary) hypertension: Secondary | ICD-10-CM | POA: Insufficient documentation

## 2020-01-06 DIAGNOSIS — I2510 Atherosclerotic heart disease of native coronary artery without angina pectoris: Secondary | ICD-10-CM | POA: Diagnosis not present

## 2020-01-06 DIAGNOSIS — N4340 Spermatocele of epididymis, unspecified: Secondary | ICD-10-CM

## 2020-01-06 HISTORY — PX: SPERMATOCELECTOMY: SHX2420

## 2020-01-06 LAB — POCT I-STAT, CHEM 8
BUN: 16 mg/dL (ref 6–20)
Calcium, Ion: 1.12 mmol/L — ABNORMAL LOW (ref 1.15–1.40)
Chloride: 100 mmol/L (ref 98–111)
Creatinine, Ser: 1 mg/dL (ref 0.61–1.24)
Glucose, Bld: 90 mg/dL (ref 70–99)
HCT: 42 % (ref 39.0–52.0)
Hemoglobin: 14.3 g/dL (ref 13.0–17.0)
Potassium: 3.7 mmol/L (ref 3.5–5.1)
Sodium: 139 mmol/L (ref 135–145)
TCO2: 28 mmol/L (ref 22–32)

## 2020-01-06 SURGERY — EXCISION, SPERMATOCELE
Anesthesia: General | Site: Scrotum | Laterality: Left

## 2020-01-06 MED ORDER — FENTANYL CITRATE (PF) 100 MCG/2ML IJ SOLN
INTRAMUSCULAR | Status: AC
  Administered 2020-01-06: 25 ug via INTRAVENOUS
  Filled 2020-01-06: qty 2

## 2020-01-06 MED ORDER — LIDOCAINE HCL (CARDIAC) PF 100 MG/5ML IV SOSY
PREFILLED_SYRINGE | INTRAVENOUS | Status: DC | PRN
  Administered 2020-01-06: 100 mg via INTRAVENOUS

## 2020-01-06 MED ORDER — HYDROCODONE-ACETAMINOPHEN 5-325 MG PO TABS: 1.0000 | ORAL_TABLET | ORAL | 0 refills | Status: AC | PRN

## 2020-01-06 MED ORDER — FENTANYL CITRATE (PF) 100 MCG/2ML IJ SOLN
25.0000 ug | INTRAMUSCULAR | Status: DC | PRN
  Administered 2020-01-06: 25 ug via INTRAVENOUS

## 2020-01-06 MED ORDER — PROPOFOL 10 MG/ML IV BOLUS
INTRAVENOUS | Status: AC
  Filled 2020-01-06: qty 20

## 2020-01-06 MED ORDER — OXYCODONE HCL 5 MG/5ML PO SOLN: 5.0000 mg | Freq: Once | ORAL | Status: DC | PRN

## 2020-01-06 MED ORDER — BACITRACIN ZINC 500 UNIT/GM EX OINT
TOPICAL_OINTMENT | CUTANEOUS | Status: AC
  Filled 2020-01-06: qty 28.35

## 2020-01-06 MED ORDER — EPHEDRINE SULFATE 50 MG/ML IJ SOLN
INTRAMUSCULAR | Status: DC | PRN
  Administered 2020-01-06: 10 mg via INTRAVENOUS
  Administered 2020-01-06: 5 mg via INTRAVENOUS
  Administered 2020-01-06: 10 mg via INTRAVENOUS

## 2020-01-06 MED ORDER — EPHEDRINE 5 MG/ML INJ
INTRAVENOUS | Status: AC
  Filled 2020-01-06: qty 10

## 2020-01-06 MED ORDER — CEFAZOLIN SODIUM-DEXTROSE 2-4 GM/100ML-% IV SOLN
INTRAVENOUS | Status: AC
  Filled 2020-01-06: qty 100

## 2020-01-06 MED ORDER — MIDAZOLAM HCL 2 MG/2ML IJ SOLN
INTRAMUSCULAR | Status: AC
  Filled 2020-01-06: qty 2

## 2020-01-06 MED ORDER — ACETAMINOPHEN 10 MG/ML IV SOLN
INTRAVENOUS | Status: DC | PRN
  Administered 2020-01-06: 1000 mg via INTRAVENOUS

## 2020-01-06 MED ORDER — FENTANYL CITRATE (PF) 100 MCG/2ML IJ SOLN
INTRAMUSCULAR | Status: AC
  Filled 2020-01-06: qty 2

## 2020-01-06 MED ORDER — MIDAZOLAM HCL 2 MG/2ML IJ SOLN
INTRAMUSCULAR | Status: DC | PRN
  Administered 2020-01-06: 2 mg via INTRAVENOUS

## 2020-01-06 MED ORDER — DEXAMETHASONE SODIUM PHOSPHATE 10 MG/ML IJ SOLN
INTRAMUSCULAR | Status: AC
  Filled 2020-01-06: qty 1

## 2020-01-06 MED ORDER — GLYCOPYRROLATE 0.2 MG/ML IJ SOLN
INTRAMUSCULAR | Status: AC
  Filled 2020-01-06: qty 1

## 2020-01-06 MED ORDER — DEXAMETHASONE SODIUM PHOSPHATE 10 MG/ML IJ SOLN
INTRAMUSCULAR | Status: DC | PRN
  Administered 2020-01-06: 5 mg via INTRAVENOUS

## 2020-01-06 MED ORDER — LIDOCAINE HCL (PF) 2 % IJ SOLN
INTRAMUSCULAR | Status: AC
  Filled 2020-01-06: qty 5

## 2020-01-06 MED ORDER — OXYCODONE HCL 5 MG PO TABS: 5.0000 mg | ORAL_TABLET | Freq: Once | ORAL | Status: DC | PRN

## 2020-01-06 MED ORDER — LACTATED RINGERS IV SOLN: INTRAVENOUS | Status: DC

## 2020-01-06 MED ORDER — BACITRACIN 500 UNIT/GM EX OINT
TOPICAL_OINTMENT | CUTANEOUS | Status: DC | PRN
  Administered 2020-01-06: 1 via TOPICAL

## 2020-01-06 MED ORDER — PROPOFOL 10 MG/ML IV BOLUS
INTRAVENOUS | Status: DC | PRN
  Administered 2020-01-06: 170 mg via INTRAVENOUS

## 2020-01-06 MED ORDER — FENTANYL CITRATE (PF) 100 MCG/2ML IJ SOLN
INTRAMUSCULAR | Status: DC | PRN
  Administered 2020-01-06 (×2): 50 ug via INTRAVENOUS
  Administered 2020-01-06: 100 ug via INTRAVENOUS

## 2020-01-06 MED ORDER — ONDANSETRON HCL 4 MG/2ML IJ SOLN
INTRAMUSCULAR | Status: AC
  Filled 2020-01-06: qty 2

## 2020-01-06 MED ORDER — PHENYLEPHRINE HCL (PRESSORS) 10 MG/ML IV SOLN
INTRAVENOUS | Status: DC | PRN
  Administered 2020-01-06: 100 ug via INTRAVENOUS
  Administered 2020-01-06: 50 ug via INTRAVENOUS
  Administered 2020-01-06: 100 ug via INTRAVENOUS

## 2020-01-06 MED ORDER — FAMOTIDINE 20 MG PO TABS
ORAL_TABLET | ORAL | Status: AC
  Filled 2020-01-06: qty 1

## 2020-01-06 MED ORDER — ACETAMINOPHEN 10 MG/ML IV SOLN
INTRAVENOUS | Status: AC
  Filled 2020-01-06: qty 100

## 2020-01-06 MED ORDER — FAMOTIDINE 20 MG PO TABS
20.0000 mg | ORAL_TABLET | Freq: Once | ORAL | Status: AC
  Administered 2020-01-06: 20 mg via ORAL

## 2020-01-06 MED ORDER — BUPIVACAINE HCL (PF) 0.5 % IJ SOLN
INTRAMUSCULAR | Status: AC
  Filled 2020-01-06: qty 30

## 2020-01-06 MED ORDER — BUPIVACAINE HCL 0.5 % IJ SOLN
INTRAMUSCULAR | Status: DC | PRN
  Administered 2020-01-06: 10 mL

## 2020-01-06 MED ORDER — GLYCOPYRROLATE 0.2 MG/ML IJ SOLN
INTRAMUSCULAR | Status: DC | PRN
  Administered 2020-01-06: .2 mg via INTRAVENOUS

## 2020-01-06 SURGICAL SUPPLY — 34 items
BLADE SURG 15 STRL LF DISP TIS (BLADE) ×1 IMPLANT
BLADE SURG 15 STRL SS (BLADE) ×2
CANISTER SUCT 1200ML W/VALVE (MISCELLANEOUS) ×3 IMPLANT
CHLORAPREP W/TINT 26 (MISCELLANEOUS) ×5 IMPLANT
COVER WAND RF STERILE (DRAPES) ×3 IMPLANT
DERMABOND ADVANCED (GAUZE/BANDAGES/DRESSINGS) ×2
DERMABOND ADVANCED .7 DNX12 (GAUZE/BANDAGES/DRESSINGS) ×1 IMPLANT
DRAPE LAPAROTOMY 77X122 PED (DRAPES) ×3 IMPLANT
DRSG GAUZE FLUFF 36X18 (GAUZE/BANDAGES/DRESSINGS) ×3 IMPLANT
DRSG TELFA 3X8 NADH (GAUZE/BANDAGES/DRESSINGS) ×3 IMPLANT
ELECT CAUTERY NEEDLE TIP 1.0 (MISCELLANEOUS) ×3
ELECT REM PT RETURN 9FT ADLT (ELECTROSURGICAL) ×3
ELECTRODE CAUTERY NEDL TIP 1.0 (MISCELLANEOUS) ×1 IMPLANT
ELECTRODE REM PT RTRN 9FT ADLT (ELECTROSURGICAL) ×1 IMPLANT
GLOVE BIOGEL PI IND STRL 7.5 (GLOVE) ×1 IMPLANT
GLOVE BIOGEL PI INDICATOR 7.5 (GLOVE) ×2
GOWN STRL REUS W/ TWL LRG LVL3 (GOWN DISPOSABLE) ×1 IMPLANT
GOWN STRL REUS W/ TWL XL LVL3 (GOWN DISPOSABLE) ×1 IMPLANT
GOWN STRL REUS W/TWL LRG LVL3 (GOWN DISPOSABLE) ×2
GOWN STRL REUS W/TWL XL LVL3 (GOWN DISPOSABLE) ×2
KIT TURNOVER KIT A (KITS) ×3 IMPLANT
LABEL OR SOLS (LABEL) ×3 IMPLANT
NDL HYPO 25X1 1.5 SAFETY (NEEDLE) ×1 IMPLANT
NEEDLE HYPO 25X1 1.5 SAFETY (NEEDLE) ×3 IMPLANT
NS IRRIG 1000ML POUR BTL (IV SOLUTION) ×3 IMPLANT
PACK BASIN MINOR (MISCELLANEOUS) ×3 IMPLANT
PAD DRESSING TELFA 3X8 NADH (GAUZE/BANDAGES/DRESSINGS) ×1 IMPLANT
SUPPORT SCROTAL LG STRP (MISCELLANEOUS) ×1 IMPLANT
SUPPORTER ATHLETIC LG (MISCELLANEOUS) ×1
SUT CHROMIC 3 0 SH 27 (SUTURE) ×5 IMPLANT
SUT VIC AB 3-0 SH 27 (SUTURE) ×2
SUT VIC AB 3-0 SH 27X BRD (SUTURE) ×1 IMPLANT
SYR 10ML LL (SYRINGE) ×3 IMPLANT
SYR BULB IRRIG 60ML STRL (SYRINGE) ×3 IMPLANT

## 2020-01-06 NOTE — Anesthesia Preprocedure Evaluation (Addendum)
Anesthesia Evaluation  Patient identified by MRN, date of birth, ID band Patient awake    Reviewed: Allergy & Precautions, NPO status   History of Anesthesia Complications Negative for: history of anesthetic complications  Airway Mallampati: II       Dental   Pulmonary neg sleep apnea, neg COPD, Not current smoker,           Cardiovascular hypertension, Pt. on medications + CAD, + Past MI and + Cardiac Stents  (-) CHF (-) dysrhythmias (-) Valvular Problems/Murmurs     Neuro/Psych neg Seizures    GI/Hepatic Neg liver ROS, neg GERD  ,  Endo/Other  neg diabetesHypothyroidism (s/p partial thyroidectomy, on no meds now)   Renal/GU negative Renal ROS     Musculoskeletal   Abdominal   Peds  Hematology   Anesthesia Other Findings   Reproductive/Obstetrics                            Anesthesia Physical Anesthesia Plan  ASA: III  Anesthesia Plan: General   Post-op Pain Management:    Induction: Intravenous  PONV Risk Score and Plan: 2 and Dexamethasone and Ondansetron  Airway Management Planned: LMA  Additional Equipment:   Intra-op Plan:   Post-operative Plan:   Informed Consent: I have reviewed the patients History and Physical, chart, labs and discussed the procedure including the risks, benefits and alternatives for the proposed anesthesia with the patient or authorized representative who has indicated his/her understanding and acceptance.       Plan Discussed with:   Anesthesia Plan Comments:         Anesthesia Quick Evaluation

## 2020-01-06 NOTE — H&P (Signed)
UROLOGY H&P UPDATE  Agree with prior H&P dated 12/20/2019.  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  Cardiac: RRR Lungs: CTA bilaterally  Laterality: Left Procedure: Left spermatocele ectomy  Informed consent obtained, we specifically discussed the risks of bleeding, infection, post-operative pain, need for additional procedures, recurrence, bruising/swelling.  Billey Co, MD 01/06/2020

## 2020-01-06 NOTE — Discharge Instructions (Signed)

## 2020-01-06 NOTE — Anesthesia Postprocedure Evaluation (Signed)
Anesthesia Post Note  Patient: Richard Underwood.  Procedure(s) Performed: SPERMATOCELECTOMY (Left Scrotum)  Patient location during evaluation: PACU Anesthesia Type: General Level of consciousness: awake and alert Pain management: pain level controlled Vital Signs Assessment: post-procedure vital signs reviewed and stable Respiratory status: spontaneous breathing, nonlabored ventilation, respiratory function stable and patient connected to nasal cannula oxygen Cardiovascular status: blood pressure returned to baseline and stable Postop Assessment: no apparent nausea or vomiting Anesthetic complications: no     Last Vitals:  Vitals:   01/06/20 1627 01/06/20 1642  BP: 123/79 (!) 148/85  Pulse: 72 72  Resp: 14 17  Temp: 36.7 C 36.7 C  SpO2: 93% 97%    Last Pain:  Vitals:   01/06/20 1642  TempSrc: Tympanic  PainSc: 4                  Jinnifer Montejano K Yasmina [place]

## 2020-01-06 NOTE — Op Note (Signed)
Date of procedure: 01/06/20  Preoperative diagnosis:  1. Left spermatocele  Postoperative diagnosis:  1. Same  Procedure: 1. Left spermatocelectomy  Surgeon: Nickolas Madrid, MD  Anesthesia: General  Complications: None  Intraoperative findings:  1.  Located left spermatocelectomy  EBL: Minimal  Specimens: Spermatocele  Drains: None  Indication: Richard Underwood. is a 59 y.o. patient with large 6 cm left spermatocele that has become increasingly bulky and bothersome, and he opted for surgical removal.  After reviewing the management options for treatment, they elected to proceed with the above surgical procedure(s). We have discussed the potential benefits and risks of the procedure, side effects of the proposed treatment, the likelihood of the patient achieving the goals of the procedure, and any potential problems that might occur during the procedure or recuperation. Informed consent has been obtained.  Description of procedure:  The patient was taken to the operating room and general anesthesia was induced. SCDs were placed for DVT prophylaxis. The patient was placed in the supine position, prepped and draped in the usual sterile fashion, and preoperative antibiotics(Ancef) were administered. A preoperative time-out was performed.   A 4 cm incision was made in the left hemiscrotum and we dissected through dartos until the tunica was encountered.  The testicle was delivered into the field.  The tunica vaginalis was opened revealing a large 6 cm spermatocele that was intimately involved with the testicle and epididymis.  This was meticulously dissected free using electrocautery.  At the conclusion, the testicle and epididymis remained intact with no evidence of bleeding or vascular compromise.  Meticulous hemostasis was achieved.  The scrotal pouch was irrigated thoroughly.  The testicle was returned in its normal anatomic orientation.  A running 3-0 Vicryl was used to close the  dartos, and a running 3-0 chromic was used to close the skin.  10 cc of lidocaine were injected alongside the incision.  Bacitracin, Telfa, scrotal fluffs, and jockstrap were applied  Disposition: Stable to PACU  Plan: Follow-up in clinic in 1 month for wound check  Nickolas Madrid, MD

## 2020-01-06 NOTE — Anesthesia Procedure Notes (Signed)
Procedure Name: LMA Insertion Date/Time: 01/06/2020 2:34 PM Performed by: Rudean Hitt, CRNA Pre-anesthesia Checklist: Patient identified, Patient being monitored, Timeout performed, Emergency Drugs available and Suction available Patient Re-evaluated:Patient Re-evaluated prior to induction Oxygen Delivery Method: Circle system utilized Preoxygenation: Pre-oxygenation with 100% oxygen Induction Type: IV induction LMA: LMA inserted LMA Size: 4.5 Tube type: Oral Number of attempts: 2 Placement Confirmation: positive ETCO2 and breath sounds checked- equal and bilateral Tube secured with: Tape Dental Injury: Teeth and Oropharynx as per pre-operative assessment

## 2020-01-06 NOTE — Transfer of Care (Signed)
Immediate Anesthesia Transfer of Care Note  Patient: Richard Underwood.  Procedure(s) Performed: Procedure(s): SPERMATOCELECTOMY (Left)  Patient Location: PACU  Anesthesia Type:General  Level of Consciousness: sedated  Airway & Oxygen Therapy: Patient Spontanous Breathing and Patient connected to face mask oxygen  Post-op Assessment: Report given to RN and Post -op Vital signs reviewed and stable  Post vital signs: Reviewed and stable  Last Vitals:  Vitals:   01/06/20 1257 01/06/20 1557  BP: (!) 150/81 (!) 153/95  Pulse: (!) 54 92  Resp: 16 12  Temp: (!) 36.1 C 36.8 C  SpO2: A999333 [id number]    Complications: No apparent anesthesia complications

## 2020-01-10 LAB — SURGICAL PATHOLOGY

## 2020-01-11 ENCOUNTER — Telehealth: Payer: Self-pay

## 2020-01-11 NOTE — Telephone Encounter (Signed)
Patient notified

## 2020-01-11 NOTE — Telephone Encounter (Signed)
-----   Message from Billey Co, MD sent at 01/10/2020  2:56 PM EDT ----- Spermatocele from surgery with no evidence of cancer or anything worrisome, follow up as scheduled.  Nickolas Madrid, MD 01/10/2020

## 2020-01-13 ENCOUNTER — Telehealth: Payer: Self-pay

## 2020-01-13 NOTE — Telephone Encounter (Signed)
sw patient having testicular pain. Patient denies any fever,chills, N/V/D, redness, increased swelling or drainage. Advised patient to wear compressive underwear, ice, elevate. And if able to take tylenol and advil. Patient verbalized understanding, and any worsening symptoms will call back/

## 2020-01-16 ENCOUNTER — Other Ambulatory Visit: Payer: Self-pay

## 2020-01-16 ENCOUNTER — Ambulatory Visit (INDEPENDENT_AMBULATORY_CARE_PROVIDER_SITE_OTHER): Payer: 59 | Admitting: Urology

## 2020-01-16 ENCOUNTER — Encounter: Payer: Self-pay | Admitting: Urology

## 2020-01-16 VITALS — BP 145/87 | HR 72 | Ht 68.0 in | Wt 226.0 lb

## 2020-01-16 DIAGNOSIS — N4340 Spermatocele of epididymis, unspecified: Secondary | ICD-10-CM

## 2020-01-16 NOTE — Progress Notes (Signed)
01/16/2020 9:17 AM   Richard Underwood Richard Underwood. 1961-02-22 [id number]  Referring provider: Glean Hess, MD [address] [place],  [place] [address]  Chief Complaint  Patient presents with  . Post-op Problem    incision site infection    HPI: Mr. Richard Underwood is a 59 year old male who is status post left spermatocelectomy who presents today with concern for possible infected incision.  He underwent a left spermatocelectomy on Jan 06, 2020 with Dr. Diamantina Underwood for a large 6 cm spermatocele that had become increasingly bulky and bothersome.  Pathology was benign.  He contacted the office on May 14 stating he was still having testicular pain.  He was advised to wear compressive underwear, ice the scrotum and to elevate the scrotum.  He was advised to take Tylenol or Advil for discomfort and to contact the office if the symptoms worsened.  Today, he states he is having pain at the incision site.  He also is noticing a mass that pushes his left testicle back.  He did state the scrotal swelling has reduced.  He has had some blood appear on his undergarments.  He has not noted a purulent drainage.  Patient denies any modifying or aggravating factors.  Patient denies any gross hematuria, dysuria or suprapubic/flank pain.  Patient denies any fevers, chills, nausea or vomiting.    PMH: Past Medical History:  Diagnosis Date  . Family history of adverse reaction to anesthesia     brother went into coma (hx aids weak immune system chronic pneumonia)  . Heart disease   . Hypercholesteremia   . Hypertension   . Hypothyroidism 07/25/2014  . Thyroid disease     Surgical History: Past Surgical History:  Procedure Laterality Date  . COLONOSCOPY  08/2016   Physcian requested to do again in 3 years. Polyps were found.  . CORONARY ANGIOPLASTY WITH STENT PLACEMENT  07/26/2014   stent placement LAD  . SPERMATOCELECTOMY Left 01/06/2020   Procedure: SPERMATOCELECTOMY;  Surgeon: Billey Co,  MD;  Location: [facility] ORS;  Service: Urology;  Laterality: Left;  . THYROID SURGERY  2008   partial thyroidectomy for benign mass    Home Medications:  Allergies as of 01/16/2020      Reactions   Septra [sulfamethoxazole-trimethoprim]    Blistered on penis   Tetracyclines & Related Rash   Blister of skin      Medication List       Accurate as of Jan 16, 2020  9:17 AM. If you have any questions, ask your nurse or doctor.        aspirin EC 81 MG tablet Take 81 mg by mouth daily.   atorvastatin 80 MG tablet Commonly known as: LIPITOR TAKE 1 TABLET DAILY   JOINT HEALTH PO Take 2 tablets by mouth daily.   metoprolol succinate 50 MG 24 hr tablet Commonly known as: TOPROL-XL Take 50 mg by mouth daily. Take with or immediately following a meal.   One Daily tablet Take 1 tablet by mouth daily.   Potassium Citrate 15 MEQ (1620 MG) Tbcr Take 2 tablets by mouth this morning, then take 2 tablets by mouth this evening.   prasugrel 10 MG Tabs tablet Commonly known as: EFFIENT Take 10 mg by mouth daily.   telmisartan-hydrochlorothiazide 80-25 MG tablet Commonly known as: MICARDIS HCT TAKE 1 TABLET DAILY What changed:   how much to take  how to take this  when to take this  additional instructions  Allergies:  Allergies  Allergen Reactions  . Septra [Sulfamethoxazole-Trimethoprim]     Blistered on penis  . Tetracyclines & Related Rash    Blister of skin    Family History: Family History  Problem Relation Age of Onset  . Diabetes Mother   . Hypertension Mother   . Diabetes Maternal Grandmother   . Diabetes Maternal Grandfather     Social History:  reports that he has never smoked. He has never used smokeless tobacco. He reports current alcohol use of about 1.0 standard drinks of alcohol per week. He reports that he does not use drugs.  ROS: Pertinent ROS in HPI  Physical Exam: BP (!) 145/87   Pulse 72   Ht 5\' 8"  (1.727 m)   Wt 226 lb (102.5 kg)    BMI 34.36 kg/m   Constitutional:  Well nourished. Alert and oriented, No acute distress. HEENT: [place] AT, mask in place.  Trachea midline. Cardiovascular: No clubbing, cyanosis, or edema. Respiratory: Normal respiratory effort, no increased work of breathing. GU:  Patient with uncircumcised phallus.  Foreskin easily retracted  Urethral meatus is patent.  No penile discharge. No penile lesions or rashes. Scrotum without lesions, cysts, rashes and/or edema.  Mild bruising on the scrotum and foreskin consistent with prior surgery.  Testicles are located scrotally bilaterally. No masses are appreciated in the testicles. Right epididymis are normal.  Left epididymis obscured by fluid and testicle.  Incision in the left hemiscrotum is clean and dry.  There has been a small amount of blood seen on undergarments.  The left hemiscrotum is without induration, erythema, fluctuance or crepitus.  A reactive hydrocele is likely present. Lymph: No inguinal adenopathy. Neurologic: Grossly intact, no focal deficits, moving all 4 extremities. Psychiatric: Normal mood and affect.  Laboratory Data: Lab Results  Component Value Date   WBC 7.1 01/04/2020   HGB 14.3 01/06/2020   HCT 42.0 01/06/2020   MCV 84.9 01/04/2020   PLT 200 01/04/2020    Lab Results  Component Value Date   CREATININE 1.00 01/06/2020    Lab Results  Component Value Date   TESTOSTERONE 297 11/12/2016    Lab Results  Component Value Date   HGBA1C 5.8 (H) 07/16/2018    Lab Results  Component Value Date   TSH 2.120 07/26/2019       Component Value Date/Time   CHOL 160 07/26/2019 0833   HDL 40 07/26/2019 0833   CHOLHDL 4.0 07/26/2019 0833   CHOLHDL 3.2 04/24/2017 1031   VLDL 13 04/24/2017 1031   LDLCALC 95 07/26/2019 0833    Lab Results  Component Value Date   AST 33 07/26/2019   Lab Results  Component Value Date   ALT 33 07/26/2019    Urinalysis    Component Value Date/Time   BILIRUBINUR neg 07/26/2019 0820    PROTEINUR Positive (A) 07/26/2019 0820   UROBILINOGEN 0.2 07/26/2019 0820   NITRITE neg 07/26/2019 0820   LEUKOCYTESUR Negative 07/26/2019 0820    I have reviewed the labs.   Pertinent Imaging: No recent imaging  Assessment & Plan:    1. Spermatocele -s/p left spermatocelectomy on 01/06/2020 -Reassured the patient that the findings on exam are consistent with the postoperative healing process -Suggested that the patient use nonstick gauze as a barrier between the incision and the undergarments to help with the frictional pain -Reviewed red flag signs such as purulent drainage from the incision, fever, increase of swelling in the scrotum that causes a tightening of the skin or  erythema -Encouraged to keep his follow-up appointment on June 18 for his postoperative recheck and to call with any further concerns or questions in the interim   Return for returned as scheduled on 06/18 .  These notes generated with voice recognition software. I apologize for typographical errors.  Zara Council, PA-C  [facility] [address]  [place], [place] [address] [phone]

## 2020-01-18 ENCOUNTER — Encounter: Payer: Self-pay | Admitting: Urology

## 2020-01-18 ENCOUNTER — Ambulatory Visit (INDEPENDENT_AMBULATORY_CARE_PROVIDER_SITE_OTHER): Payer: 59 | Admitting: Urology

## 2020-01-18 ENCOUNTER — Other Ambulatory Visit: Payer: Self-pay

## 2020-01-18 VITALS — BP 164/98 | HR 86 | Ht 68.0 in | Wt 226.0 lb

## 2020-01-18 DIAGNOSIS — N4340 Spermatocele of epididymis, unspecified: Secondary | ICD-10-CM

## 2020-01-18 MED ORDER — CEPHALEXIN 500 MG PO CAPS: 500.0000 mg | ORAL_CAPSULE | Freq: Four times a day (QID) | ORAL | 0 refills | Status: DC

## 2020-01-18 NOTE — Progress Notes (Signed)
01/18/2020 4:36 PM   [name] Jul 27, 1961 [id number]  Referring provider: Glean Hess, MD [address] [place],  [place] [address]  Chief Complaint  Patient presents with  . Wound Check    HPI: Mr. Micke is a 59 year old male who is status post left spermatocelectomy who presents today with concern for pus coming from his incision.    He underwent a left spermatocelectomy on Jan 06, 2020 with Dr. Diamantina [place] for a large 6 cm spermatocele that had become increasingly bulky and bothersome.  Pathology was benign.  He contacted the office on May 14 stating he was still having testicular pain.  He was advised to wear compressive underwear, ice the scrotum and to elevate the scrotum.  He was advised to take Tylenol or Advil for discomfort and to contact the office if the symptoms worsened.  On 01/16/2020, he stated he is having pain and blood draining from the incision site  He also is noticing a mass that pushes his left testicle back.  His exam was consistent with postoperative changes and he was reassured.  Today,    PMH: Past Medical History:  Diagnosis Date  . Family history of adverse reaction to anesthesia     brother went into coma (hx aids weak immune system chronic pneumonia)  . Heart disease   . Hypercholesteremia   . Hypertension   . Hypothyroidism 07/25/2014  . Thyroid disease     Surgical History: Past Surgical History:  Procedure Laterality Date  . COLONOSCOPY  08/2016   Physcian requested to do again in 3 years. Polyps were found.  . CORONARY ANGIOPLASTY WITH STENT PLACEMENT  07/26/2014   stent placement LAD  . SPERMATOCELECTOMY Left 01/06/2020   Procedure: SPERMATOCELECTOMY;  Surgeon: Billey Co, MD;  Location: [facility] ORS;  Service: Urology;  Laterality: Left;  . THYROID SURGERY  2008   partial thyroidectomy for benign mass    Home Medications:  Allergies as of 01/18/2020      Reactions   Septra  [sulfamethoxazole-trimethoprim]    Blistered on penis   Tetracyclines & Related Rash   Blister of skin      Medication List       Accurate as of Jan 18, 2020  4:36 PM. If you have any questions, ask your nurse or doctor.        aspirin EC 81 MG tablet Take 81 mg by mouth daily.   atorvastatin 80 MG tablet Commonly known as: LIPITOR TAKE 1 TABLET DAILY   cephALEXin 500 MG capsule Commonly known as: KEFLEX Take 1 capsule (500 mg total) by mouth 4 (four) times daily. Started by: Zara Council, PA-C   JOINT HEALTH PO Take 2 tablets by mouth daily.   metoprolol succinate 50 MG 24 hr tablet Commonly known as: TOPROL-XL Take 50 mg by mouth daily. Take with or immediately following a meal.   One Daily tablet Take 1 tablet by mouth daily.   Potassium Citrate 15 MEQ (1620 MG) Tbcr Take 2 tablets by mouth this morning, then take 2 tablets by mouth this evening.   prasugrel 10 MG Tabs tablet Commonly known as: EFFIENT Take 10 mg by mouth daily.   telmisartan-hydrochlorothiazide 80-25 MG tablet Commonly known as: MICARDIS HCT TAKE 1 TABLET DAILY What changed:   how much to take  how to take this  when to take this  additional instructions       Allergies:  Allergies  Allergen Reactions  .  Septra [Sulfamethoxazole-Trimethoprim]     Blistered on penis  . Tetracyclines & Related Rash    Blister of skin    Family History: Family History  Problem Relation Age of Onset  . Diabetes Mother   . Hypertension Mother   . Diabetes Maternal Grandmother   . Diabetes Maternal Grandfather     Social History:  reports that he has never smoked. He has never used smokeless tobacco. He reports current alcohol use of about 1.0 standard drinks of alcohol per week. He reports that he does not use drugs.  ROS: Pertinent ROS in HPI  Physical Exam: BP (!) 164/98   Pulse 86   Ht 5\' 8"  (1.727 m)   Wt 226 lb (102.5 kg)   BMI 34.36 kg/m   Constitutional:  Well nourished.  Alert and oriented, No acute distress. HEENT: [place] AT, mask in place  Trachea midline Cardiovascular: No clubbing, cyanosis, or edema. Respiratory: Normal respiratory effort, no increased work of breathing. GI: Abdomen is soft, non tender, non distended, no abdominal masses.  GU: No CVA tenderness.  No bladder fullness or masses.  Patient with uncircumcised phallus. Foreskin easily retracted  Urethral meatus is patent.  No penile discharge. No penile lesions or rashes. Scrotum without lesions, cysts, rashes and/or edema. Mild bruising consistent with surgery.  Testicles are located scrotally bilaterally.  No masses are appreciated in the testicles. Left and right epididymis are normal.  Hydrocele decreasing.  Left hemi-scrotal incision clean and dry with granulation tissue around the suture.  No crepitus, fluctuance or erythema noted.    Lymph: No inguinal adenopathy. Neurologic: Grossly intact, no focal deficits, moving all 4 extremities. Psychiatric: Normal mood and affect.  Laboratory Data: Lab Results  Component Value Date   WBC 7.1 01/04/2020   HGB 14.3 01/06/2020   HCT 42.0 01/06/2020   MCV 84.9 01/04/2020   PLT 200 01/04/2020    Lab Results  Component Value Date   CREATININE 1.00 01/06/2020    Lab Results  Component Value Date   TESTOSTERONE 297 11/12/2016    Lab Results  Component Value Date   HGBA1C 5.8 (H) 07/16/2018    Lab Results  Component Value Date   TSH 2.120 07/26/2019       Component Value Date/Time   CHOL 160 07/26/2019 0833   HDL 40 07/26/2019 0833   CHOLHDL 4.0 07/26/2019 0833   CHOLHDL 3.2 04/24/2017 1031   VLDL 13 04/24/2017 1031   LDLCALC 95 07/26/2019 0833    Lab Results  Component Value Date   AST 33 07/26/2019   Lab Results  Component Value Date   ALT 33 07/26/2019    Urinalysis    Component Value Date/Time   BILIRUBINUR neg 07/26/2019 0820   PROTEINUR Positive (A) 07/26/2019 0820   UROBILINOGEN 0.2 07/26/2019 0820   NITRITE  neg 07/26/2019 0820   LEUKOCYTESUR Negative 07/26/2019 0820    I have reviewed the labs.   Pertinent Imaging: No recent imaging  Assessment & Plan:    1. Spermatocele -s/p left spermatocelectomy on 01/06/2020 -Explained to patient that the findings on exam are consistent with the postoperative healing process -Continue to use nonstick gauze as a barrier between the incision and the undergarments to help with the frictional pain -Incisional pain is increased - given Keflex 500 mg QID x 5 days for possible subclinical infection -Reviewed red flag signs such as purulent drainage from the incision, fever, increase of swelling in the scrotum that causes a tightening of the skin  or erythema -Encouraged to keep his follow-up appointment on June 18 for his postoperative recheck and to call with any further concerns or questions in the interim   Return for return for scheduled follow up .  These notes generated with voice recognition software. I apologize for typographical errors.  Zara Council, PA-C  [facility] [address]  [place], [place] [address] [phone]

## 2020-01-27 ENCOUNTER — Other Ambulatory Visit: Payer: Self-pay | Admitting: Internal Medicine

## 2020-01-27 DIAGNOSIS — E782 Mixed hyperlipidemia: Secondary | ICD-10-CM

## 2020-02-01 ENCOUNTER — Other Ambulatory Visit: Payer: Self-pay | Admitting: Internal Medicine

## 2020-02-01 DIAGNOSIS — I10 Essential (primary) hypertension: Secondary | ICD-10-CM

## 2020-02-01 DIAGNOSIS — E782 Mixed hyperlipidemia: Secondary | ICD-10-CM

## 2020-02-01 NOTE — Telephone Encounter (Signed)
Requested Prescriptions  Pending Prescriptions Disp Refills  . atorvastatin (LIPITOR) 80 MG tablet [Pharmacy Med Name: ATORVASTATIN TAB 80MG ] 90 tablet 1    Sig: TAKE 1 TABLET DAILY     Cardiovascular:  Antilipid - Statins Failed - 02/01/2020  2:21 AM      Failed - LDL in normal range and within 360 days    LDL Chol Calc (NIH)  Date Value Ref Range Status  07/26/2019 95 0 - 99 mg/dL Final         Passed - Total Cholesterol in normal range and within 360 days    Cholesterol, Total  Date Value Ref Range Status  07/26/2019 160 100 - 199 mg/dL Final         Passed - HDL in normal range and within 360 days    HDL  Date Value Ref Range Status  07/26/2019 40 >39 mg/dL Final         Passed - Triglycerides in normal range and within 360 days    Triglycerides  Date Value Ref Range Status  07/26/2019 142 0 - 149 mg/dL Final         Passed - Patient is not pregnant      Passed - Valid encounter within last 12 months    Recent Outpatient Visits          1 month ago Mass of left testicle   [facility] Glean Hess, MD   6 months ago Annual physical exam   [facility] Glean Hess, MD   9 months ago Essential hypertension   [facility] Glean Hess, MD   1 year ago Essential hypertension   [facility] Glean Hess, [facility]   1 year ago Annual physical exam   [facility] Glean Hess, MD      Future Appointments            In 2 weeks [address], [facility]           . telmisartan-hydrochlorothiazide (MICARDIS HCT) 80-25 MG tablet [Pharmacy Med Name: TELMISAR/HCT TAB 80-25MG ] 90 tablet 3    Sig: TAKE 1 TABLET DAILY     Cardiovascular: ARB + Diuretic Combos Failed - 02/01/2020  2:21 AM      Failed - Ca in normal range and within 180 days    Calcium  Date Value Ref Range Status  01/04/2020 9.0 8.9 - 10.3 mg/dL Final   Calcium, Ion  Date Value Ref Range Status   01/06/2020 1.12 (L) 1.15 - 1.40 mmol/L Final         Failed - Last BP in normal range    BP Readings from Last 1 Encounters:  01/18/20 (!) 164/98         Passed - K in normal range and within 180 days    Potassium  Date Value Ref Range Status  01/06/2020 3.7 3.5 - 5.1 mmol/L Final         Passed - Na in normal range and within 180 days    Sodium  Date Value Ref Range Status  01/06/2020 139 135 - 145 mmol/L Final  07/26/2019 141 134 - 144 mmol/L Final         Passed - Cr in normal range and within 180 days    Creatinine, Ser  Date Value Ref Range Status  01/06/2020 1.00 0.61 - 1.24 mg/dL Final         Passed - Patient is  not pregnant      Passed - Valid encounter within last 6 months    Recent Outpatient Visits          1 month ago Mass of left testicle   [facility] Glean Hess, MD   6 months ago Annual physical exam   [facility] Glean Hess, MD   9 months ago Essential hypertension   [facility] Glean Hess, MD   1 year ago Essential hypertension   [place], Laura H, MD   1 year ago Annual physical exam   [facility] Glean Hess, MD      Future Appointments            In 2 weeks [address], [facility]           Next Lipid panel due in November 2021.

## 2020-02-17 ENCOUNTER — Ambulatory Visit (INDEPENDENT_AMBULATORY_CARE_PROVIDER_SITE_OTHER): Payer: 59 | Admitting: Physician Assistant

## 2020-02-17 ENCOUNTER — Other Ambulatory Visit: Payer: Self-pay

## 2020-02-17 VITALS — BP 165/94 | HR 62 | Ht 68.0 in | Wt 228.0 lb

## 2020-02-17 DIAGNOSIS — N4340 Spermatocele of epididymis, unspecified: Secondary | ICD-10-CM | POA: Diagnosis not present

## 2020-02-17 NOTE — Progress Notes (Signed)
02/17/2020 9:18 AM   Richard Underwood. 01/07/1961 [id number]  CC: Chief Complaint  Patient presents with  . Post-op Follow-up    HPI: Richard Underwood. is a 59 y.o. male s/p left spermatocelectomy with Dr. Diamantina Providence on 01/06/2020 who presents today for 6-week postop wound recheck.  Today, patient reports doing well since surgery.  He does state that his left testicle is a bit more sensitive than it was at baseline, especially around his incision.  He has switched to brief underwear because of this.  Additionally, he has noted that his left testicle sits differently than it did before surgery.  He has been able to palpate what he believes is a bundle of blood vessels within the left hemiscrotum, new since surgery. He denies testicular pain, dysuria, fever, chills, urgency, frequency, and discharge from his surgical incision.  PMH: Past Medical History:  Diagnosis Date  . Family history of adverse reaction to anesthesia     brother went into coma (hx aids weak immune system chronic pneumonia)  . Heart disease   . Hypercholesteremia   . Hypertension   . Hypothyroidism 07/25/2014  . Thyroid disease     Surgical History: Past Surgical History:  Procedure Laterality Date  . COLONOSCOPY  08/2016   Physcian requested to do again in 3 years. Polyps were found.  . CORONARY ANGIOPLASTY WITH STENT PLACEMENT  07/26/2014   stent placement LAD  . SPERMATOCELECTOMY Left 01/06/2020   Procedure: SPERMATOCELECTOMY;  Surgeon: Billey Co, MD;  Location: [facility] ORS;  Service: Urology;  Laterality: Left;  . THYROID SURGERY  2008   partial thyroidectomy for benign mass    Home Medications:  Allergies as of 02/17/2020      Reactions   Septra [sulfamethoxazole-trimethoprim]    Blistered on penis   Tetracyclines & Related Rash   Blister of skin      Medication List       Accurate as of February 17, 2020  9:18 AM. If you have any questions, ask your nurse or doctor.        STOP  taking these medications   cephALEXin 500 MG capsule Commonly known as: KEFLEX Stopped by: Debroah Loop, PA-C     TAKE these medications   aspirin EC 81 MG tablet Take 81 mg by mouth daily.   atorvastatin 80 MG tablet Commonly known as: LIPITOR TAKE 1 TABLET DAILY   JOINT HEALTH PO Take 2 tablets by mouth daily.   metoprolol succinate 50 MG 24 hr tablet Commonly known as: TOPROL-XL Take 50 mg by mouth daily. Take with or immediately following a meal.   One Daily tablet Take 1 tablet by mouth daily.   Potassium Citrate 15 MEQ (1620 MG) Tbcr Take 2 tablets by mouth this morning, then take 2 tablets by mouth this evening.   prasugrel 10 MG Tabs tablet Commonly known as: EFFIENT Take 10 mg by mouth daily.   telmisartan-hydrochlorothiazide 80-25 MG tablet Commonly known as: MICARDIS HCT TAKE 1 TABLET DAILY       Allergies:  Allergies  Allergen Reactions  . Septra [Sulfamethoxazole-Trimethoprim]     Blistered on penis  . Tetracyclines & Related Rash    Blister of skin    Family History: Family History  Problem Relation Age of Onset  . Diabetes Mother   . Hypertension Mother   . Diabetes Maternal Grandmother   . Diabetes Maternal Grandfather     Social History:   reports that he has never smoked.  He has never used smokeless tobacco. He reports current alcohol use of about 1.0 standard drink of alcohol per week. He reports that he does not use drugs.  Physical Exam: BP (!) 165/94   Pulse 62   Ht 5\' 8"  (1.727 m)   Wt 228 lb (103.4 kg)   BMI 34.67 kg/m   Constitutional:  Alert and oriented, no acute distress, nontoxic appearing HEENT: [place], AT Cardiovascular: No clubbing, cyanosis, or edema Respiratory: Normal respiratory effort, no increased work of breathing GU: Bilateral descended testicles with intact vasa.  Left testicle slightly edematous throughout with mild tenderness to palpation.  Surgical incision visualized, well-healed without  drainage. Skin: No rashes, bruises or suspicious lesions Neurologic: Grossly intact, no focal deficits, moving all 4 extremities Psychiatric: Normal mood and affect  Laboratory Data: Results for orders placed or performed during the hospital encounter of 01/06/20  Surgical pathology  Result Value Ref Range   SURGICAL PATHOLOGY      SURGICAL PATHOLOGY CASE: [id number] PATIENT: Richard Underwood Surgical Pathology Report     Specimen Submitted: A. Spermatocele, left  Clinical History: Spermatocele      DIAGNOSIS: A. SPERMATOCELE, LEFT EPIDIDYMIS; SPERMATOCELECTOMY: - SPERMATOCELE.  GROSS DESCRIPTION: A. Labeled: Spermatocele Received: In formalin Tissue fragment(s): 1 Size: 6.8 x 4.5 x 2.8 cm Description: A multiloculated pink-tan cystic structure which contains clear fluid Representative sections submitted in 1 cassette.   Final Diagnosis performed by Bryan Lemma, MD.   Electronically signed 01/10/2020 2:20:26PM The electronic signature indicates that the named Attending Pathologist has evaluated the specimen Technical component performed at [facility], [address] [place], [place] [address] Lab: [phone] Dir: Rush Farmer, MD, MMM  Professional component performed at [facility], [facility], [place], [place], [place] [address] Lab: [phone] Dir: T ara C. Reuel Derby, MD    Assessment & Plan:   1. Spermatocele Healing well 6 weeks after spermatocelectomy.  Counseled patient that residual scrotal edema would resolve in the coming weeks.  Encouraged scrotal support.  Shared negative pathology results.  Patient to follow-up as needed.  I did not appreciate a possible varicocele on physical exam today, though may consider repeat scrotal ultrasound in the future if swelling persists.  Would not recommend this before 3 months after surgery.  Return if symptoms worsen or fail to improve.  Debroah Loop, PA-C  [facility] [address] [place], [place] [address] [phone]

## 2020-02-17 NOTE — Patient Instructions (Signed)
You are healing well after surgery. Continue scrotal support: wearing compressive underwear, elevating the scrotum, and using ice as needed (20 minutes at a time maximum, always with fabric between the ice and your skin. The swelling will continue to resolve over the coming weeks.  You may follow up with [place] as needed. If you have persistent swelling or tenderness 3 months after surgery, please call our office for reevaluation.

## 2020-07-16 ENCOUNTER — Other Ambulatory Visit: Payer: Self-pay | Admitting: Internal Medicine

## 2020-07-16 DIAGNOSIS — I10 Essential (primary) hypertension: Secondary | ICD-10-CM

## 2020-08-07 ENCOUNTER — Other Ambulatory Visit: Payer: Self-pay | Admitting: Podiatry

## 2020-08-08 ENCOUNTER — Ambulatory Visit (INDEPENDENT_AMBULATORY_CARE_PROVIDER_SITE_OTHER): Payer: 59 | Admitting: Internal Medicine

## 2020-08-08 ENCOUNTER — Encounter: Payer: Self-pay | Admitting: Internal Medicine

## 2020-08-08 ENCOUNTER — Other Ambulatory Visit: Payer: Self-pay

## 2020-08-08 VITALS — BP 138/86 | HR 60 | Temp 97.6°F | Ht 68.0 in | Wt 239.0 lb

## 2020-08-08 DIAGNOSIS — I259 Chronic ischemic heart disease, unspecified: Secondary | ICD-10-CM

## 2020-08-08 DIAGNOSIS — I240 Acute coronary thrombosis not resulting in myocardial infarction: Secondary | ICD-10-CM | POA: Diagnosis not present

## 2020-08-08 DIAGNOSIS — Z125 Encounter for screening for malignant neoplasm of prostate: Secondary | ICD-10-CM | POA: Diagnosis not present

## 2020-08-08 DIAGNOSIS — Z0000 Encounter for general adult medical examination without abnormal findings: Secondary | ICD-10-CM

## 2020-08-08 DIAGNOSIS — I10 Essential (primary) hypertension: Secondary | ICD-10-CM

## 2020-08-08 DIAGNOSIS — M67449 Ganglion, unspecified hand: Secondary | ICD-10-CM | POA: Diagnosis not present

## 2020-08-08 DIAGNOSIS — R059 Cough, unspecified: Secondary | ICD-10-CM

## 2020-08-08 DIAGNOSIS — E782 Mixed hyperlipidemia: Secondary | ICD-10-CM

## 2020-08-08 LAB — POCT URINALYSIS DIPSTICK
Bilirubin, UA: NEGATIVE
Blood, UA: NEGATIVE
Glucose, UA: NEGATIVE
Ketones, UA: NEGATIVE
Leukocytes, UA: NEGATIVE
Nitrite, UA: NEGATIVE
Protein, UA: POSITIVE — AB
Spec Grav, UA: 1.01 (ref 1.010–1.025)
Urobilinogen, UA: 0.2 E.U./dL
pH, UA: 6 (ref 5.0–8.0)

## 2020-08-08 MED ORDER — ATORVASTATIN CALCIUM 80 MG PO TABS: 80.0000 mg | ORAL_TABLET | Freq: Every day | ORAL | 3 refills | Status: DC

## 2020-08-08 NOTE — Progress Notes (Signed)
Date:  08/08/2020   Name:  Richard Underwood [facility].   DOB:  April 28, 1961   MRN:  [id number]   Chief Complaint: Annual Exam, Cough (Cough, nasal congestion, no fever. Says this happens every year in this season. Cough woke up him up last night in his sleep.), and Hypertension (Been having up and down blood pressures.)  Richard Underwood. is a 59 y.o. male who presents today for his Complete Annual Exam. He feels fairly well. He reports exercising - few times weekly. He reports he is sleeping fairly well.   Colonoscopy: 09/2019  Immunization History  Administered Date(s) Administered  . Tdap 07/16/2018  . Zoster Recombinat (Shingrix) 01/17/2019, 04/21/2019    Hypertension This is a chronic problem. The problem is controlled (slightly elevated at cardiology visit). Pertinent negatives include no chest pain, headaches, palpitations or shortness of breath. Past treatments include angiotensin blockers, diuretics and beta blockers. The current treatment provides significant improvement. Hypertensive end-organ damage includes CAD/MI (PTCA and stent 2015).  Hyperlipidemia The problem is controlled. Pertinent negatives include no chest pain, myalgias or shortness of breath. Current antihyperlipidemic treatment includes statins. The current treatment provides significant improvement of lipids.  Cough This is a new problem. The current episode started yesterday. The problem occurs hourly. The cough is non-productive. Pertinent negatives include no chest pain, chills, fever, headaches, myalgias, rash, shortness of breath or wheezing. He has tried OTC cough suppressant for the symptoms.    Lab Results  Component Value Date   CREATININE 1.00 01/06/2020   BUN 16 01/06/2020   NA 139 01/06/2020   K 3.7 01/06/2020   CL 100 01/06/2020   CO2 30 01/04/2020   Lab Results  Component Value Date   CHOL 160 07/26/2019   HDL 40 07/26/2019   LDLCALC 95 07/26/2019   TRIG 142 07/26/2019   CHOLHDL 4.0  07/26/2019   Lab Results  Component Value Date   TSH 2.120 07/26/2019   Lab Results  Component Value Date   HGBA1C 5.8 (H) 07/16/2018   Lab Results  Component Value Date   WBC 7.1 01/04/2020   HGB 14.3 01/06/2020   HCT 42.0 01/06/2020   MCV 84.9 01/04/2020   PLT 200 01/04/2020   Lab Results  Component Value Date   ALT 33 07/26/2019   AST 33 07/26/2019   ALKPHOS 113 07/26/2019   BILITOT 0.4 07/26/2019     Review of Systems  Constitutional: Negative for appetite change, chills, diaphoresis, fatigue, fever and unexpected weight change.  HENT: Negative for hearing loss, tinnitus, trouble swallowing and voice change.   Eyes: Negative for visual disturbance.  Respiratory: Positive for cough. Negative for choking, shortness of breath and wheezing.   Cardiovascular: Negative for chest pain, palpitations and leg swelling.  Gastrointestinal: Negative for abdominal pain, blood in stool, constipation and diarrhea.  Genitourinary: Negative for difficulty urinating, dysuria and frequency.  Musculoskeletal: Negative for arthralgias, back pain and myalgias.  Skin: Negative for color change and rash.  Neurological: Negative for dizziness, syncope and headaches.  Hematological: Negative for adenopathy.  Psychiatric/Behavioral: Negative for dysphoric mood and sleep disturbance.    Patient Active Problem List   Diagnosis Date Noted  . Digital mucous cyst of finger 08/08/2020  . Tubular adenoma of colon 07/16/2018  . 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 Ischemic heart disease due to coronary artery obstruction ([place]) 08/07/2014  . Mixed hyperlipidemia 07/25/2014  . Essential hypertension 07/25/2014  . Palpitations 07/25/2014    Allergies  Allergen Reactions  .  Septra [Sulfamethoxazole-Trimethoprim]     Blistered on penis  . Tetracyclines & Related Rash    Blister of skin    Past Surgical History:  Procedure Laterality Date  . COLONOSCOPY  08/2016   Physcian requested to do  again in 3 years. Polyps were found.  . CORONARY ANGIOPLASTY WITH STENT PLACEMENT  07/26/2014   stent placement LAD  . SPERMATOCELECTOMY Left 01/06/2020   Procedure: SPERMATOCELECTOMY;  Surgeon: Billey Co, MD;  Location: [facility] ORS;  Service: Urology;  Laterality: Left;  . THYROID SURGERY  2008   partial thyroidectomy for benign mass    Social History   Tobacco Use  . Smoking status: Never Smoker  . Smokeless tobacco: Never Used  Vaping Use  . Vaping Use: Never used  Substance Use Topics  . Alcohol use: Yes    Alcohol/week: 1.0 standard drink    Types: 1 Cans of beer per week    Comment: occassionally  . Drug use: No     Medication list has been reviewed and updated.  Current Meds  Medication Sig  . aspirin EC 81 MG tablet Take 81 mg by mouth daily.   Marland Kitchen atorvastatin (LIPITOR) 80 MG tablet Take 1 tablet (80 mg total) by mouth daily.  Marland Kitchen Bioflavonoid Products (BIOFLEX) TABS Take 2 tablets by mouth daily.  Marland Kitchen loratadine (CLARITIN) 10 MG tablet Take 10 mg by mouth daily.  . metoprolol succinate (TOPROL-XL) 50 MG 24 hr tablet Take 50 mg by mouth daily. Take with or immediately following a meal.  . Misc Natural Products (JOINT HEALTH PO) Take 2 tablets by mouth daily.  . Multiple Vitamin (ONE DAILY) tablet Take 1 tablet by mouth daily.   . prasugrel (EFFIENT) 10 MG TABS tablet Take 10 mg by mouth daily.  Marland Kitchen telmisartan-hydrochlorothiazide (MICARDIS HCT) 80-25 MG tablet TAKE 1 TABLET DAILY (Patient taking differently: Take 1 tablet by mouth daily. TAKE 1 TABLET DAILY)  . [DISCONTINUED] atorvastatin (LIPITOR) 80 MG tablet TAKE 1 TABLET DAILY (Patient taking differently: Take 80 mg by mouth daily. )    PHQ 2/9 Scores 08/08/2020 12/12/2019 07/26/2019 04/21/2019  PHQ - 2 Score 0 0 0 0  PHQ- 9 Score 0 0 0 -    GAD 7 : Generalized Anxiety Score 08/08/2020  Nervous, Anxious, on Edge 0  Control/stop worrying 0  Worry too much - different things 0  Trouble relaxing 0  Restless 0   Easily annoyed or irritable 0  Afraid - awful might happen 0  Total GAD 7 Score 0  Anxiety Difficulty Not difficult at all    BP Readings from Last 3 Encounters:  08/08/20 138/86  02/17/20 (!) 165/94  01/18/20 (!) 164/98    Physical Exam Vitals and nursing note reviewed.  Constitutional:      Appearance: Normal appearance. He is well-developed.  HENT:     Head: Normocephalic.     Right Ear: Tympanic membrane, ear canal and external ear normal.     Left Ear: Tympanic membrane, ear canal and external ear normal.     Nose: Nose normal.     Right Sinus: No maxillary sinus tenderness or frontal sinus tenderness.     Left Sinus: No maxillary sinus tenderness or frontal sinus tenderness.     Mouth/Throat:     Pharynx: Uvula midline.  Eyes:     Conjunctiva/sclera: Conjunctivae normal.     Pupils: Pupils are equal, round, and reactive to light.  Neck:     Thyroid: No thyromegaly.  Vascular: No carotid bruit.  Cardiovascular:     Rate and Rhythm: Normal rate and regular rhythm.     Heart sounds: Normal heart sounds.  Pulmonary:     Effort: Pulmonary effort is normal.     Breath sounds: Normal breath sounds. No decreased breath sounds, wheezing or rhonchi.  Chest:     Breasts:        Right: No mass.        Left: No mass.  Abdominal:     General: Bowel sounds are normal.     Palpations: Abdomen is soft.     Tenderness: There is no abdominal tenderness.  Musculoskeletal:        General: Normal range of motion.     Cervical back: Normal range of motion and neck supple.     Right lower leg: No edema.     Left lower leg: No edema.     Comments: 4 mm mucus cyst of DIP left middle finger - non tender  Lymphadenopathy:     Cervical: No cervical adenopathy.  Skin:    General: Skin is warm and dry.  Neurological:     Mental Status: He is alert and oriented to person, place, and time.     Deep Tendon Reflexes: Reflexes are normal and symmetric.  Psychiatric:        Speech:  Speech normal.        Behavior: Behavior normal.        Thought Content: Thought content normal.        Judgment: Judgment normal.     Wt Readings from Last 3 Encounters:  08/08/20 239 lb (108.4 kg)  02/17/20 228 lb (103.4 kg)  01/18/20 226 lb (102.5 kg)    BP 138/86   Pulse 60   Temp 97.6 F (36.4 C) (Oral)   Ht 5\' 8"  (1.727 m)   Wt 239 lb (108.4 kg)   SpO2 99%   BMI 36.34 kg/m   Assessment and Plan: 1. Annual physical exam Exam is normal except for weight. Encourage regular exercise and appropriate dietary changes. - POCT urinalysis dipstick - VITAMIN D 25 Hydroxy (Vit-D Deficiency, Fractures)  2. Prostate cancer screening DRE deferred - PSA  3. Essential hypertension Clinically stable exam with well controlled BP on metoprolol, ARB and hctz. He should continue to monitor BP at home.  Follow up in 6 months Tolerating medications without side effects at this time. Pt to continue current regimen and low sodium diet; benefits of regular exercise as able discussed. - CBC with Differential/Platelet - Comprehensive metabolic panel  4. Ischemic heart disease due to coronary artery obstruction ([place]) Followed by Cardiology No sx at this time with exercise Continue Prasugrel, aspirin and Lipitor.  5. Mixed hyperlipidemia On high dose high intensity statin without side effects - Lipid panel - atorvastatin (LIPITOR) 80 MG tablet; Take 1 tablet (80 mg total) by mouth daily.  Dispense: 90 tablet; Refill: 3  6. Cough Likely due to PND - continue daily Claritin No indication for antibiotics at this time  7. Digital mucous cyst of finger Can refer to Ortho if symptoms worsen   Partially dictated using Editor, commissioning. Any errors are unintentional.  Halina Maidens, MD [facility]  08/08/2020

## 2020-08-09 ENCOUNTER — Encounter
Admission: RE | Admit: 2020-08-09 | Discharge: 2020-08-09 | Disposition: A | Discharge status: Home / Self Care | Payer: 59 | Source: Ambulatory Visit | Attending: Podiatry | Admitting: Podiatry

## 2020-08-09 ENCOUNTER — Other Ambulatory Visit: Payer: 59

## 2020-08-09 HISTORY — DX: Palpitations: R00.2

## 2020-08-09 HISTORY — DX: Atherosclerotic heart disease of native coronary artery without angina pectoris: I25.10

## 2020-08-09 HISTORY — DX: Personal history of urinary calculi: Z87.442

## 2020-08-09 LAB — COMPREHENSIVE METABOLIC PANEL
ALT: 23 IU/L (ref 0–44)
AST: 26 IU/L (ref 0–40)
Albumin/Globulin Ratio: 1.6 (ref 1.2–2.2)
Albumin: 4.4 g/dL (ref 3.8–4.8)
Alkaline Phosphatase: 98 IU/L (ref 44–121)
BUN/Creatinine Ratio: 17 (ref 10–24)
BUN: 18 mg/dL (ref 8–27)
Bilirubin Total: 0.4 mg/dL (ref 0.0–1.2)
CO2: 24 mmol/L (ref 20–29)
Calcium: 9.6 mg/dL (ref 8.6–10.2)
Chloride: 101 mmol/L (ref 96–106)
Creatinine, Ser: 1.04 mg/dL (ref 0.76–1.27)
GFR calc Af Amer: 89 mL/min/{1.73_m2} (ref >59)
GFR calc non Af Amer: 77 mL/min/{1.73_m2} (ref >59)
Globulin, Total: 2.7 g/dL (ref 1.5–4.5)
Glucose: 91 mg/dL (ref 65–99)
Potassium: 4.4 mmol/L (ref 3.5–5.2)
Sodium: 141 mmol/L (ref 134–144)
Total Protein: 7.1 g/dL (ref 6.0–8.5)

## 2020-08-09 LAB — CBC WITH DIFFERENTIAL/PLATELET
Basophils Absolute: 0.1 10*3/uL (ref 0.0–0.2)
Basos: 1 %
EOS (ABSOLUTE): 0.2 10*3/uL (ref 0.0–0.4)
Eos: 2 %
Hematocrit: 43.3 % (ref 37.5–51.0)
Hemoglobin: 14.6 g/dL (ref 13.0–17.7)
Immature Grans (Abs): 0 10*3/uL (ref 0.0–0.1)
Immature Granulocytes: 0 %
Lymphocytes Absolute: 2.8 10*3/uL (ref 0.7–3.1)
Lymphs: 35 %
MCH: 27.9 pg (ref 26.6–33.0)
MCHC: 33.7 g/dL (ref 31.5–35.7)
MCV: 83 fL (ref 79–97)
Monocytes Absolute: 0.7 10*3/uL (ref 0.1–0.9)
Monocytes: 9 %
Neutrophils Absolute: 4.1 10*3/uL (ref 1.4–7.0)
Neutrophils: 53 %
Platelets: 184 10*3/uL (ref 150–450)
RBC: 5.24 x10E6/uL (ref 4.14–5.80)
RDW: 12.9 % (ref 11.6–15.4)
WBC: 7.9 10*3/uL (ref 3.4–10.8)

## 2020-08-09 LAB — LIPID PANEL
Chol/HDL Ratio: 3.3 ratio (ref 0.0–5.0)
Cholesterol, Total: 166 mg/dL (ref 100–199)
HDL: 51 mg/dL (ref >39)
LDL Chol Calc (NIH): 95 mg/dL (ref 0–99)
Triglycerides: 112 mg/dL (ref 0–149)
VLDL Cholesterol Cal: 20 mg/dL (ref 5–40)

## 2020-08-09 LAB — PSA: Prostate Specific Ag, Serum: 0.9 ng/mL (ref 0.0–4.0)

## 2020-08-09 LAB — VITAMIN D 25 HYDROXY (VIT D DEFICIENCY, FRACTURES): Vit D, 25-Hydroxy: 42.8 ng/mL (ref 30.0–100.0)

## 2020-08-09 NOTE — Patient Instructions (Addendum)
Your procedure is scheduled on: Friday, December 17 Report to the Registration Desk on the 1st floor of the [facility]. To find out your arrival time, please call [phone] between 1PM - 3PM on: Thursday, December 16  REMEMBER: Instructions that are not followed completely may result in serious medical risk, up to and including death; or upon the discretion of your surgeon and anesthesiologist your surgery may need to be rescheduled.  Do not eat food after midnight the night before surgery.  No gum chewing, lozengers or hard candies.  You may however, drink CLEAR liquids up to 2 hours before you are scheduled to arrive for your surgery. Do not drink anything within 2 hours of your scheduled arrival time.  Clear liquids include: - water  - apple juice without pulp - gatorade (not RED, PURPLE, OR BLUE) - black coffee or tea (Do NOT add milk or creamers to the coffee or tea) Do NOT drink anything that is not on this list.  In addition, your doctor has ordered for you to drink the provided  Ensure Pre-Surgery Clear Carbohydrate Drink  Drinking this carbohydrate drink up to two hours before surgery helps to reduce insulin resistance and improve patient outcomes. Please complete drinking 2 hours prior to scheduled arrival time.  TAKE THESE MEDICATIONS THE MORNING OF SURGERY WITH A SIP OF WATER:  1.  Atorvastatin 2.  Metoprolol  Follow recommendations from Cardiologist, Pulmonologist or PCP regarding stopping Aspirin and prasugrel. Per cardiology note; stop 5 days prior to procedure. Last day to take is Saturday, December 11; resume after surgery per surgeon.  One week prior to surgery: Stop Anti-inflammatories (NSAIDS) such as Advil, Aleve, Ibuprofen, Motrin, Naproxen, Naprosyn and Aspirin based products such as Excedrin, Goodys Powder, BC Powder. Stop ANY OVER THE COUNTER supplements until after surgery. Stop Joint Health (However, you may continue taking multivitamin up until the  day before surgery.)  No Alcohol for 24 hours before or after surgery.  No Smoking including e-cigarettes for 24 hours prior to surgery.  No chewable tobacco products for at least 6 hours prior to surgery.  No nicotine patches on the day of surgery.  Do not use any "recreational" drugs for at least a week prior to your surgery.  Please be advised that the combination of cocaine and anesthesia may have negative outcomes, up to and including death. If you test positive for cocaine, your surgery will be cancelled.  On the morning of surgery brush your teeth with toothpaste and water, you may rinse your mouth with mouthwash if you wish. Do not swallow any toothpaste or mouthwash.  Do not wear jewelry, make-up, hairpins, clips or nail polish.  Do not wear lotions, powders, or perfumes.   Do not shave body from the neck down 48 hours prior to surgery just in case you cut yourself which could leave a site for infection.  Also, freshly shaved skin may become irritated if using the CHG soap.  Do not bring valuables to the hospital. [facility] is not responsible for any missing/lost belongings or valuables.   Use CHG Soap as directed on instruction sheet.  Notify your doctor if there is any change in your medical condition (cold, fever, infection).  Wear comfortable clothing (specific to your surgery type) to the hospital.  Plan for stool softeners for home use; pain medications have a tendency to cause constipation. You can also help prevent constipation by eating foods high in fiber such as fruits and vegetables and drinking plenty  of fluids as your diet allows.  After surgery, you can help prevent lung complications by doing breathing exercises.  Take deep breaths and cough every 1-2 hours. Your doctor may order a device called an Incentive Spirometer to help you take deep breaths.  If you are being discharged the day of surgery, you will not be allowed to drive home. You will need a  responsible adult (18 years or older) to drive you home and stay with you that night.   If you are taking public transportation, you will need to have a responsible adult (18 years or older) with you. Please confirm with your physician that it is acceptable to use public transportation.   Please call the [facility] at [phone] if you have any questions about these instructions.  Visitation Policy:  Patients undergoing a surgery or procedure may have one family member or support person with them as long as that person is not COVID-19 positive or experiencing its symptoms.  That person may remain in the waiting area during the procedure.

## 2020-08-10 ENCOUNTER — Encounter
Admission: RE | Admit: 2020-08-10 | Discharge: 2020-08-10 | Disposition: A | Discharge status: Home / Self Care | Payer: 59 | Source: Ambulatory Visit | Attending: Podiatry | Admitting: Podiatry

## 2020-08-10 ENCOUNTER — Other Ambulatory Visit: Payer: Self-pay

## 2020-08-10 DIAGNOSIS — Z01810 Encounter for preprocedural cardiovascular examination: Secondary | ICD-10-CM | POA: Diagnosis present

## 2020-08-10 DIAGNOSIS — I2510 Atherosclerotic heart disease of native coronary artery without angina pectoris: Secondary | ICD-10-CM | POA: Diagnosis not present

## 2020-08-10 DIAGNOSIS — I10 Essential (primary) hypertension: Secondary | ICD-10-CM | POA: Diagnosis not present

## 2020-08-13 ENCOUNTER — Encounter: Payer: Self-pay | Admitting: Podiatry

## 2020-08-13 NOTE — Progress Notes (Signed)
[facility] Perioperative Services  Pre-Admission/Anesthesia Testing Clinical Review  Date: 08/13/20  Patient Demographics:  Name: Richard Underwood. 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 Clinical nursing staff has updated patient's PMH/PSHx, current medication list, and drug allergies/intolerances to ensure comprehensive history available to assist in medical decision making as it pertains to the aforementioned surgical procedure and anticipated anesthetic course.   Clinical Discussion:  Richard Underwood. is a 59 y.o. male who is submitted for pre-surgical anesthesia review and clearance prior to him undergoing the above procedure. Patient has never been a smoker. Pertinent PMH includes: CAD, NSTEMI, palpitations, HTN, HLD, hypothyroidism.  Patient is followed by cardiology Richard Underwood, ANP-C). He was last seen in the cardiology clinic on 08/06/2020; notes reviewed.  At the time of his clinic visit, patient doing well from a cardiovascular standpoint.  He denied any chest pain, shortness breath, PND, orthopnea, palpitations, peripheral edema, vertiginous symptoms, and presyncope/syncope.  Patient making efforts to remain active; functional capacity per DASI is > 4 METS.  Patient underwent TTE and 07/2014 that revealed normal left ventricular systolic function; LVEF 41-96%.  Subsequent myocardial perfusion imaging revealed a large in size, severe, nearly complete  reversible defect involving the anterior wall of the heart consistent with ischemia; LVEF 63%.  Patient underwent PCI procedure on 08/07/2014 that revealed a 95% lesion to the proximal LAD which was treated with a DES x 1 resulting and restoration of TIMI grade III flow (see full interpretation of cardiovascular testing and interventions below).  Patient was started on DAPT therapy (ASA + prasugrel); continues to be compliant with this therapy with no evidence of bleeding.  Blood pressure elevated in the office at 161/87 on currently prescribed beta-blocker, diuretic, and ARB regimen.  Goal blood pressure <130/80; deferred further interventions during office visit as patient reporting blood pressures often will be < 120/80 at home.  Discussed potential need to add CCB in the future; patient verbalized understanding.  Patient is on a statin for his HLD.  No changes were made to his medication regimen during this visit.  Patient to follow-up with outpatient cardiology in 1 year or sooner if needed.  Patient scheduled to undergo an elective podiatric procedure on 08/17/2020 with Dr. Samara Deist.  Given patient's past medical history significant for cardiovascular disease and intervention, presurgical cardiac clearance was sought by PAT team. Per cardiology, "this patient is optimized for surgery and may proceed with the planned procedural course with a LOW risk stratification".  Again, this patient is on daily DAPT (ASA + prasugrel) therapy. He has been instructed on recommendations for holding both of these medications for 7 days prior to his procedure.   He denies previous perioperative complications with anesthesia. He underwent a general anesthetic course here (ASA III) in 12/2019 with no documented complications.   Vitals with BMI 08/09/2020 08/08/2020 02/17/2020  Height $Remov'5\' 8"'krwvcs$  $Remove'5\' 8"'weLMcFO$  $RemoveB'5\' 8"'uMBuIiUR$   Weight 238 lbs  239 lbs 228 lbs  BMI 36.2 64.40 34.74  Systolic - 259 563  Diastolic - 86 94  Pulse - 60 62     Providers/Specialists:   NOTE: Primary physician provider listed below. Patient may have been seen by APP or partner within same practice.   PROVIDER ROLE LAST Montel Culver, DPM Podiatry(Surgeon)  08/06/2020  Glean Hess, MD Primary Care Provider  08/08/2020  Priscella Mann, [facility]  08/06/2020   Allergies:  Septra [sulfamethoxazole-trimethoprim] and Tetracyclines & related  Current Home Medications:   No current facility-administered medications for this encounter.   Marland Kitchen aspirin EC 81 MG tablet  . loratadine (CLARITIN) 10 MG tablet  . metoprolol succinate (TOPROL-XL) 50 MG 24 hr tablet  . Misc Natural Products (JOINT HEALTH PO)  . Multiple Vitamin (ONE DAILY) tablet  . prasugrel (EFFIENT) 10 MG TABS tablet  . telmisartan-hydrochlorothiazide (MICARDIS HCT) 80-25 MG tablet  . atorvastatin (LIPITOR) 80 MG tablet   History:   Past Medical History:  Diagnosis Date  . Coronary artery disease   . Family history of adverse reaction to anesthesia     brother went into coma (hx aids weak immune system chronic pneumonia)  . Heart disease   . History of kidney stones   . Hypercholesteremia   . Hypertension   . Hypothyroidism 07/25/2014  . Palpitations   . Thyroid disease    Past Surgical History:  Procedure Laterality Date  . COLONOSCOPY  08/2016   Physcian requested to do again in 3 years. Polyps were found.  . CORONARY ANGIOPLASTY WITH STENT PLACEMENT  07/26/2014   stent placement LAD  . SPERMATOCELECTOMY Left 01/06/2020   Procedure: SPERMATOCELECTOMY;  Surgeon: Billey Co, MD;  Location: [facility] ORS;  Service: Urology;  Laterality: Left;  . THYROID SURGERY  2008   partial thyroidectomy for benign mass   Family History  Problem Relation Age of Onset  . Diabetes Mother   . Hypertension Mother   . Diabetes Maternal Grandmother   . Diabetes Maternal Grandfather    Social History   Tobacco Use  . Smoking status: Never Smoker  . Smokeless  tobacco: Never Used  Vaping Use  . Vaping Use: Never used  Substance Use Topics  . Alcohol use: Yes    Alcohol/week: 1.0 standard drink    Types: 1 Cans of beer per week    Comment: occassionally  . Drug use: No    Pertinent Clinical Results:  LABS: Labs reviewed: Acceptable for surgery.  No visits with results within 3 Day(s) from this visit.  Latest known visit with results is:  Office Visit on 08/08/2020  Component Date Value Ref Range Status  . WBC 08/08/2020 7.9  3.4 - 10.8 x10E3/uL Final  . RBC 08/08/2020 5.24  4.14 - 5.80 x10E6/uL Final  . Hemoglobin 08/08/2020 14.6  13.0 - 17.7 g/dL Final  . Hematocrit 08/08/2020 43.3  37.5 - 51.0 % Final  . MCV 08/08/2020 83  79 - 97 fL Final  . MCH 08/08/2020 27.9  26.6 - 33.0 pg Final  . MCHC 08/08/2020 33.7  31.5 - 35.7 g/dL Final  . RDW 08/08/2020 12.9  11.6 - 15.4 % Final  . Platelets 08/08/2020 184  150 - 450 x10E3/uL Final  . Neutrophils 08/08/2020 53  Not Estab. % Final  . Lymphs 08/08/2020 35  Not Estab. % Final  . Monocytes 08/08/2020 9  Not Estab. % Final  . Eos 08/08/2020 2  Not Estab. % Final  .  Basos 08/08/2020 1  Not Estab. % Final  . Neutrophils Absolute 08/08/2020 4.1  1.4 - 7.0 x10E3/uL Final  . Lymphocytes Absolute 08/08/2020 2.8  0.7 - 3.1 x10E3/uL Final  . Monocytes Absolute 08/08/2020 0.7  0.1 - 0.9 x10E3/uL Final  . EOS (ABSOLUTE) 08/08/2020 0.2  0.0 - 0.4 x10E3/uL Final  . Basophils Absolute 08/08/2020 0.1  0.0 - 0.2 x10E3/uL Final  . Immature Granulocytes 08/08/2020 0  Not Estab. % Final  . Immature Grans (Abs) 08/08/2020 0.0  0.0 - 0.1 x10E3/uL Final  . Glucose 08/08/2020 91  65 - 99 mg/dL Final  . BUN 08/08/2020 18  8 - 27 mg/dL Final  . Creatinine, Ser 08/08/2020 1.04  0.76 - 1.27 mg/dL Final  . GFR calc non Af Amer 08/08/2020 77  >59 mL/min/1.73 Final  . GFR calc Af Amer 08/08/2020 89  >59 mL/min/1.73 Final   Comment: **In accordance with recommendations from the NKF-ASN Task force,**   [facility] is in  the process of updating its eGFR calculation to the   2021 CKD-EPI creatinine equation that estimates kidney function   without a race variable.   . BUN/Creatinine Ratio 08/08/2020 17  10 - 24 Final  . Sodium 08/08/2020 141  134 - 144 mmol/L Final  . Potassium 08/08/2020 4.4  3.5 - 5.2 mmol/L Final  . Chloride 08/08/2020 101  96 - 106 mmol/L Final  . CO2 08/08/2020 24  20 - 29 mmol/L Final  . Calcium 08/08/2020 9.6  8.6 - 10.2 mg/dL Final  . Total Protein 08/08/2020 7.1  6.0 - 8.5 g/dL Final  . Albumin 08/08/2020 4.4  3.8 - 4.8 g/dL Final  . Globulin, Total 08/08/2020 2.7  1.5 - 4.5 g/dL Final  . Albumin/Globulin Ratio 08/08/2020 1.6  1.2 - 2.2 Final  . Bilirubin Total 08/08/2020 0.4  0.0 - 1.2 mg/dL Final  . Alkaline Phosphatase 08/08/2020 98  44 - 121 IU/L Final                 **Please note reference interval change**  . AST 08/08/2020 26  0 - 40 IU/L Final  . ALT 08/08/2020 23  0 - 44 IU/L Final  . Cholesterol, Total 08/08/2020 166  100 - 199 mg/dL Final  . Triglycerides 08/08/2020 112  0 - 149 mg/dL Final  . HDL 08/08/2020 51  >39 mg/dL Final  . VLDL Cholesterol Cal 08/08/2020 20  5 - 40 mg/dL Final  . LDL Chol Calc (NIH) 08/08/2020 95  0 - 99 mg/dL Final  . Chol/HDL Ratio 08/08/2020 3.3  0.0 - 5.0 ratio Final   Comment:                                   T. Chol/HDL Ratio                                             Men  Women                               1/2 Avg.Risk  3.4    3.3  Avg.Risk  5.0    4.4                                2X Avg.Risk  9.6    7.1                                3X Avg.Risk 23.4   11.0   . Prostate Specific Ag, Serum 08/08/2020 0.9  0.0 - 4.0 ng/mL Final   Comment: Roche ECLIA methodology. According to the American Urological Association, Serum PSA should decrease and remain at undetectable levels after radical prostatectomy. The AUA defines biochemical recurrence as an initial PSA value 0.2 ng/mL or greater followed  by a subsequent confirmatory PSA value 0.2 ng/mL or greater. Values obtained with different assay methods or kits cannot be used interchangeably. Results cannot be interpreted as absolute evidence of the presence or absence of malignant disease.   . Color, UA 08/08/2020 yellow   Final  . Clarity, UA 08/08/2020 clear   Final  . Glucose, UA 08/08/2020 Negative  Negative Final  . Bilirubin, UA 08/08/2020 neg   Final  . Ketones, UA 08/08/2020 neg   Final  . Spec Grav, UA 08/08/2020 1.010  1.010 - 1.025 Final  . Blood, UA 08/08/2020 neg   Final  . pH, UA 08/08/2020 6.0  5.0 - 8.0 Final  . Protein, UA 08/08/2020 Positive* Negative Final  . Urobilinogen, UA 08/08/2020 0.2  0.2 or 1.0 E.U./dL Final  . Nitrite, UA 27/06/4140 neg   Final  . Leukocytes, UA 08/08/2020 Negative  Negative Final  . Appearance 08/08/2020 clear   Final  . Odor 08/08/2020 none   Final  . Vit D, 25-Hydroxy 08/08/2020 42.8  30.0 - 100.0 ng/mL Final   Comment: Vitamin D deficiency has been defined by the Institute of Medicine and an Endocrine Society practice guideline as a level of serum 25-OH vitamin D less than 20 ng/mL (1,2). The Endocrine Society went on to further define vitamin D insufficiency as a level between 21 and 29 ng/mL (2). 1. IOM (Institute of Medicine). 2010. Dietary reference    intakes for calcium and D. [place]: The    [employer]. 2. Holick MF, Binkley [place], Bischoff-Ferrari HA, et al.    Evaluation, treatment, and prevention of vitamin D    deficiency: an Endocrine Society clinical practice    guideline. JCEM. 2011 Jul; 96(7):1911-30.     ECG: Date: 08/10/2020 Time ECG obtained: 0753 AM Rate: 62 bpm Rhythm: Sinus rhythm with first-degree AV block; RBBB Axis (leads I and aVF): Normal Intervals: PR 214 ms. QRS 138 ms. QTc 460 ms. ST segment and T wave changes: No evidence of acute ST segment elevation or depression Comparison: Similar to previous tracing obtained on  04/08/2018   IMAGING / PROCEDURES: LEFT HEART CATHETERIZATION WITH INTERVENTION done on 08/07/2014 1. LVEF 55% 2. Mild anterior wall hypokinesis with preserved left ventricular systolic function 3. 95% stenosis in the proximal LAD prior to the takeoff of D1  Successful PCI whereby resolute DES x 1 placed with excellent angiographic result and TIMI-3 flow 4. Diffuse luminal irregularities in the LAD 5. LVEDP = 9 mm/Hg; normal  PET MYOCARDIAL PERFUSION MULTIPLE done on 07/25/2014 1. There is a large in size, severe, nearly completely reversible defect involving the apical septal, apical anterior, apical, mid anteroseptal, mid anterior and basal anteroseptal  segments. This is consistent with ischemia.  2. During stress: Global systolic function is normal. The ejection fraction calculated at 63%. There is anterior and septal hypokinesis.  3. No significant coronary calcifications were noted on the attenuation CT  4. There is mild prominence of the right ventricle.  5. Baseline EKG: NSR, normal axis, RBBB, nonspecific ST-T wave changes and inferior Q waves  Echocardiogram done on 07/25/2014 1. LVEF 60-65% 2. The left ventricle is normal in size with mildly increased wall thickness and normal contraction 3. Mitral valve mildly thickened with normal leaflet mobility; trivial regurgitation 4. The left atrium is normal in size 5. Aortic valve is trileaflet with normal excursion; no regurgitation 6. Ascending aorta is normal in diameter 7. Pulmonary artery suboptimally imaged 8. Pulmonic valve is suboptimally imaged, however there is no detectable regurgitation noted by flow Doppler 9. The right ventricle is normal in size and contraction 10. The tricuspid valve is structurally normal with mild regurgitation 11. Right atrium is normal in size 12. IVC suboptimally imaged 13. There is no evidence of pericardial effusion  Impression and Plan:  Richard Underwood. has been referred for  pre-anesthesia review and clearance prior to him undergoing the planned anesthetic and procedural courses. Available labs, pertinent testing, and imaging results were personally reviewed by me. This patient has been appropriately cleared by cardiology.   Based on clinical review performed today (08/13/20), barring any significant acute changes in the patient's overall condition, it is anticipated that he will be able to proceed with the planned surgical intervention. Any acute changes in clinical condition may necessitate his procedure being postponed and/or cancelled. Pre-surgical instructions were reviewed with the patient during his PAT appointment and questions were fielded by PAT clinical staff.  Honor Loh, MSN, APRN, FNP-C, CEN [facility]  Peri-operative Services Nurse Practitioner Phone: [phone] 08/13/20 10:25 AM  NOTE: This note has been prepared using Dragon dictation software. Despite my best ability to proofread, there is always the potential that unintentional transcriptional errors may still occur from this process.

## 2020-08-15 ENCOUNTER — Other Ambulatory Visit
Admission: RE | Admit: 2020-08-15 | Discharge: 2020-08-15 | Disposition: A | Discharge status: Home / Self Care | Payer: 59 | Source: Ambulatory Visit | Attending: Podiatry | Admitting: Podiatry

## 2020-08-15 DIAGNOSIS — Z01812 Encounter for preprocedural laboratory examination: Secondary | ICD-10-CM | POA: Diagnosis present

## 2020-08-15 DIAGNOSIS — Z20822 Contact with and (suspected) exposure to covid-19: Secondary | ICD-10-CM | POA: Diagnosis not present

## 2020-08-15 LAB — SARS CORONAVIRUS 2 (TAT 6-24 HRS): SARS Coronavirus 2: NEGATIVE

## 2020-08-17 ENCOUNTER — Ambulatory Visit: Payer: 59 | Admitting: Urgent Care

## 2020-08-17 ENCOUNTER — Ambulatory Visit
Admission: RE | Admit: 2020-08-17 | Discharge: 2020-08-17 | Disposition: A | Discharge status: Home / Self Care | Payer: 59 | Attending: Podiatry | Admitting: Podiatry

## 2020-08-17 ENCOUNTER — Other Ambulatory Visit: Payer: Self-pay

## 2020-08-17 ENCOUNTER — Encounter
Admission: RE | Disposition: A | Discharge status: Home / Self Care | Payer: Self-pay | Source: Home / Self Care | Attending: Podiatry

## 2020-08-17 ENCOUNTER — Encounter: Payer: Self-pay | Admitting: Podiatry

## 2020-08-17 DIAGNOSIS — Z7982 Long term (current) use of aspirin: Secondary | ICD-10-CM | POA: Diagnosis not present

## 2020-08-17 DIAGNOSIS — Z881 Allergy status to other antibiotic agents status: Secondary | ICD-10-CM | POA: Diagnosis not present

## 2020-08-17 DIAGNOSIS — M7661 Achilles tendinitis, right leg: Secondary | ICD-10-CM | POA: Insufficient documentation

## 2020-08-17 DIAGNOSIS — Z882 Allergy status to sulfonamides status: Secondary | ICD-10-CM | POA: Insufficient documentation

## 2020-08-17 DIAGNOSIS — Z79899 Other long term (current) drug therapy: Secondary | ICD-10-CM | POA: Insufficient documentation

## 2020-08-17 DIAGNOSIS — M899 Disorder of bone, unspecified: Secondary | ICD-10-CM | POA: Diagnosis not present

## 2020-08-17 HISTORY — PX: ACHILLES TENDON SURGERY: SHX542

## 2020-08-17 HISTORY — PX: OSTECTOMY: SHX6439

## 2020-08-17 HISTORY — DX: Non-ST elevation (NSTEMI) myocardial infarction: I21.4

## 2020-08-17 SURGERY — REPAIR, TENDON, ACHILLES
Anesthesia: General | Laterality: Right

## 2020-08-17 MED ORDER — LIDOCAINE HCL (PF) 1 % IJ SOLN
INTRAMUSCULAR | Status: AC
  Filled 2020-08-17: qty 30

## 2020-08-17 MED ORDER — ONDANSETRON HCL 4 MG/2ML IJ SOLN: 4.0000 mg | Freq: Four times a day (QID) | INTRAMUSCULAR | Status: DC | PRN

## 2020-08-17 MED ORDER — CHLORHEXIDINE GLUCONATE 0.12 % MT SOLN
OROMUCOSAL | Status: AC
  Administered 2020-08-17: 07:00:00 15 mL via OROMUCOSAL
  Filled 2020-08-17: qty 15

## 2020-08-17 MED ORDER — BUPIVACAINE-EPINEPHRINE (PF) 0.25% -1:200000 IJ SOLN
INTRAMUSCULAR | Status: AC
  Filled 2020-08-17: qty 30

## 2020-08-17 MED ORDER — MIDAZOLAM HCL 2 MG/2ML IJ SOLN
INTRAMUSCULAR | Status: DC | PRN
  Administered 2020-08-17: 2 mg via INTRAVENOUS

## 2020-08-17 MED ORDER — PROPOFOL 10 MG/ML IV BOLUS
INTRAVENOUS | Status: DC | PRN
  Administered 2020-08-17: 160 mg via INTRAVENOUS
  Administered 2020-08-17: 40 mg via INTRAVENOUS

## 2020-08-17 MED ORDER — ORAL CARE MOUTH RINSE: 15.0000 mL | Freq: Once | OROMUCOSAL | Status: AC

## 2020-08-17 MED ORDER — LACTATED RINGERS IV SOLN: INTRAVENOUS | Status: DC

## 2020-08-17 MED ORDER — POVIDONE-IODINE 7.5 % EX SOLN
Freq: Once | CUTANEOUS | Status: DC
  Filled 2020-08-17: qty 118

## 2020-08-17 MED ORDER — SUGAMMADEX SODIUM 200 MG/2ML IV SOLN
INTRAVENOUS | Status: DC | PRN
  Administered 2020-08-17: 200 mg via INTRAVENOUS

## 2020-08-17 MED ORDER — BUPIVACAINE LIPOSOME 1.3 % IJ SUSP
INTRAMUSCULAR | Status: AC
  Filled 2020-08-17: qty 20

## 2020-08-17 MED ORDER — CHLORHEXIDINE GLUCONATE 0.12 % MT SOLN: 15.0000 mL | Freq: Once | OROMUCOSAL | Status: AC

## 2020-08-17 MED ORDER — FENTANYL CITRATE (PF) 100 MCG/2ML IJ SOLN
INTRAMUSCULAR | Status: DC | PRN
  Administered 2020-08-17 (×2): 50 ug via INTRAVENOUS

## 2020-08-17 MED ORDER — ONDANSETRON HCL 4 MG PO TABS: 4.0000 mg | ORAL_TABLET | Freq: Four times a day (QID) | ORAL | Status: DC | PRN

## 2020-08-17 MED ORDER — BUPIVACAINE-EPINEPHRINE (PF) 0.25% -1:200000 IJ SOLN
INTRAMUSCULAR | Status: DC | PRN
  Administered 2020-08-17: 10 mL via PERINEURAL

## 2020-08-17 MED ORDER — ONDANSETRON HCL 4 MG/2ML IJ SOLN: 4.0000 mg | Freq: Once | INTRAMUSCULAR | Status: DC | PRN

## 2020-08-17 MED ORDER — METOCLOPRAMIDE HCL 5 MG/ML IJ SOLN: 5.0000 mg | Freq: Three times a day (TID) | INTRAMUSCULAR | Status: DC | PRN

## 2020-08-17 MED ORDER — SUCCINYLCHOLINE CHLORIDE 20 MG/ML IJ SOLN
INTRAMUSCULAR | Status: DC | PRN
  Administered 2020-08-17: 120 mg via INTRAVENOUS

## 2020-08-17 MED ORDER — CEFAZOLIN SODIUM-DEXTROSE 2-4 GM/100ML-% IV SOLN
INTRAVENOUS | Status: AC
  Filled 2020-08-17: qty 100

## 2020-08-17 MED ORDER — OXYCODONE-ACETAMINOPHEN 5-325 MG PO TABS: 1.0000 | ORAL_TABLET | Freq: Four times a day (QID) | ORAL | 0 refills | Status: DC | PRN

## 2020-08-17 MED ORDER — ONDANSETRON HCL 4 MG/2ML IJ SOLN
INTRAMUSCULAR | Status: DC | PRN
  Administered 2020-08-17: 4 mg via INTRAVENOUS

## 2020-08-17 MED ORDER — BUPIVACAINE LIPOSOME 1.3 % IJ SUSP
INTRAMUSCULAR | Status: DC | PRN
  Administered 2020-08-17: 10 mL

## 2020-08-17 MED ORDER — FAMOTIDINE 20 MG PO TABS
ORAL_TABLET | ORAL | Status: AC
  Administered 2020-08-17: 07:00:00 20 mg via ORAL
  Filled 2020-08-17: qty 1

## 2020-08-17 MED ORDER — LIDOCAINE HCL (CARDIAC) PF 100 MG/5ML IV SOSY
PREFILLED_SYRINGE | INTRAVENOUS | Status: DC | PRN
  Administered 2020-08-17: 100 mg via INTRAVENOUS

## 2020-08-17 MED ORDER — ROCURONIUM BROMIDE 100 MG/10ML IV SOLN
INTRAVENOUS | Status: DC | PRN
  Administered 2020-08-17 (×2): 10 mg via INTRAVENOUS
  Administered 2020-08-17: 30 mg via INTRAVENOUS

## 2020-08-17 MED ORDER — CEFAZOLIN SODIUM-DEXTROSE 2-4 GM/100ML-% IV SOLN
2.0000 g | INTRAVENOUS | Status: AC
  Administered 2020-08-17: 08:00:00 2 g via INTRAVENOUS

## 2020-08-17 MED ORDER — BUPIVACAINE HCL (PF) 0.5 % IJ SOLN
INTRAMUSCULAR | Status: AC
  Filled 2020-08-17: qty 30

## 2020-08-17 MED ORDER — FENTANYL CITRATE (PF) 100 MCG/2ML IJ SOLN: 25.0000 ug | INTRAMUSCULAR | Status: DC | PRN

## 2020-08-17 MED ORDER — EPHEDRINE SULFATE 50 MG/ML IJ SOLN
INTRAMUSCULAR | Status: DC | PRN
  Administered 2020-08-17 (×2): 10 mg via INTRAVENOUS

## 2020-08-17 MED ORDER — METOCLOPRAMIDE HCL 10 MG PO TABS: 5.0000 mg | ORAL_TABLET | Freq: Three times a day (TID) | ORAL | Status: DC | PRN

## 2020-08-17 MED ORDER — BUPIVACAINE HCL (PF) 0.25 % IJ SOLN
INTRAMUSCULAR | Status: AC
  Filled 2020-08-17: qty 30

## 2020-08-17 MED ORDER — DEXAMETHASONE SODIUM PHOSPHATE 10 MG/ML IJ SOLN
INTRAMUSCULAR | Status: DC | PRN
  Administered 2020-08-17: 5 mg via INTRAVENOUS

## 2020-08-17 MED ORDER — FAMOTIDINE 20 MG PO TABS: 20.0000 mg | ORAL_TABLET | Freq: Once | ORAL | Status: AC

## 2020-08-17 SURGICAL SUPPLY — 64 items
ANCH SUT 4.5 FTPRNT PEEK-OPTM (Anchor) ×1 IMPLANT
ANCHOR 4.5 FOOTPRINT ULTRA (Anchor) ×2 IMPLANT
BIT DRILL 4X4.5 FOOTPRINT STR (BIT) ×1 IMPLANT
BLADE SURG 15 STRL LF DISP TIS (BLADE) ×1 IMPLANT
BLADE SURG 15 STRL SS (BLADE) ×2
BLADE SURG MINI STRL (BLADE) ×2 IMPLANT
BNDG CMPR STD VLCR NS LF 5.8X4 (GAUZE/BANDAGES/DRESSINGS) ×2
BNDG COHESIVE 4X5 TAN STRL (GAUZE/BANDAGES/DRESSINGS) ×2 IMPLANT
BNDG CONFORM 2 STRL LF (GAUZE/BANDAGES/DRESSINGS) ×2 IMPLANT
BNDG CONFORM 3 STRL LF (GAUZE/BANDAGES/DRESSINGS) ×2 IMPLANT
BNDG ELASTIC 4X5.8 VLCR NS LF (GAUZE/BANDAGES/DRESSINGS) ×4 IMPLANT
BNDG ESMARK 4X12 TAN STRL LF (GAUZE/BANDAGES/DRESSINGS) ×2 IMPLANT
BNDG ESMARK 6X12 TAN STRL LF (GAUZE/BANDAGES/DRESSINGS) ×2 IMPLANT
BNDG GAUZE 4.5X4.1 6PLY STRL (MISCELLANEOUS) ×2 IMPLANT
BOOT STEPPER DURA MED (SOFTGOODS) ×2 IMPLANT
CANISTER SUCT 1200ML W/VALVE (MISCELLANEOUS) IMPLANT
COVER WAND RF STERILE (DRAPES) ×2 IMPLANT
CUFF TOURN SGL QUICK 18X4 (TOURNIQUET CUFF) ×2 IMPLANT
DRAPE FLUOR MINI C-ARM 54X84 (DRAPES) ×2 IMPLANT
DRILL 4X4.5 FOOTPRINT STR (BIT) ×2
DURAPREP 26ML APPLICATOR (WOUND CARE) ×2 IMPLANT
ELECT REM PT RETURN 9FT ADLT (ELECTROSURGICAL) ×2
ELECTRODE REM PT RTRN 9FT ADLT (ELECTROSURGICAL) ×1 IMPLANT
GAUZE SPONGE 4X4 12PLY STRL (GAUZE/BANDAGES/DRESSINGS) ×2 IMPLANT
GAUZE XEROFORM 1X8 LF (GAUZE/BANDAGES/DRESSINGS) ×2 IMPLANT
GLOVE BIO SURGEON STRL SZ7.5 (GLOVE) ×2 IMPLANT
GLOVE INDICATOR 8.0 STRL GRN (GLOVE) ×2 IMPLANT
GOWN STRL REUS W/ TWL XL LVL3 (GOWN DISPOSABLE) ×2 IMPLANT
GOWN STRL REUS W/TWL XL LVL3 (GOWN DISPOSABLE) ×4
HANDLE YANKAUER SUCT BULB TIP (MISCELLANEOUS) ×2 IMPLANT
IV SODIUM CHL 0.9% 250ML (IV SOLUTION) ×2 IMPLANT
KIT TURNOVER KIT A (KITS) ×2 IMPLANT
LABEL OR SOLS (LABEL) IMPLANT
MANIFOLD NEPTUNE II (INSTRUMENTS) ×2 IMPLANT
NDL MAYO CATGUT SZ5 (NEEDLE) ×2
NDL SUT 5 .5 CRC TPR PNT MAYO (NEEDLE) ×1 IMPLANT
NEEDLE 18GX1X1/2 (RX/OR ONLY) (NEEDLE) ×2 IMPLANT
NEEDLE FILTER BLUNT 18X 1/2SAF (NEEDLE) ×1
NEEDLE FILTER BLUNT 18X1 1/2 (NEEDLE) ×1 IMPLANT
NEEDLE HYPO 25X1 1.5 SAFETY (NEEDLE) ×6 IMPLANT
NS IRRIG 500ML POUR BTL (IV SOLUTION) ×2 IMPLANT
PACK EXTREMITY ARMC (MISCELLANEOUS) ×2 IMPLANT
RASP SM TEAR CROSS CUT (RASP) IMPLANT
SPLINT CAST 1 STEP 5X30 WHT (MISCELLANEOUS) ×2 IMPLANT
SPLINT FAST PLASTER 5X30 (CAST SUPPLIES) ×1
SPLINT PLASTER CAST FAST 5X30 (CAST SUPPLIES) ×1 IMPLANT
SPONGE LAP 18X18 RF (DISPOSABLE) ×2 IMPLANT
STOCKINETTE M/LG 89821 (MISCELLANEOUS) ×2 IMPLANT
STRIP CLOSURE SKIN 1/2X4 (GAUZE/BANDAGES/DRESSINGS) ×2 IMPLANT
SUT MNCRL 4-0 VIOLET P-3 (SUTURE) ×1 IMPLANT
SUT MONOCRYL 4-0 (SUTURE) ×1
SUT PDS AB 0 CT1 27 (SUTURE) IMPLANT
SUT VIC AB 0 SH 27 (SUTURE) ×2 IMPLANT
SUT VIC AB 2-0 SH 27 (SUTURE) ×4
SUT VIC AB 2-0 SH 27XBRD (SUTURE) ×2 IMPLANT
SUT VIC AB 3-0 SH 27 (SUTURE) ×2
SUT VIC AB 3-0 SH 27X BRD (SUTURE) ×1 IMPLANT
SUT VIC AB 4-0 FS2 27 (SUTURE) ×2 IMPLANT
SUT VICRYL AB 3-0 FS1 BRD 27IN (SUTURE) ×2 IMPLANT
SWABSTK COMLB BENZOIN TINCTURE (MISCELLANEOUS) ×2 IMPLANT
SYR 10ML LL (SYRINGE) ×4 IMPLANT
SYR 3ML LL SCALE MARK (SYRINGE) ×2 IMPLANT
WAND TOPAZ MICRO DEBRIDER (MISCELLANEOUS) ×2 IMPLANT
WIRE MAGNUM (SUTURE) ×4 IMPLANT

## 2020-08-17 NOTE — Discharge Instructions (Addendum)
[place] DR. TROXLER, DR. Vickki Muff, AND DR. Pleak   1. Take your medication as prescribed.  Pain medication should be taken only as needed.  2. Keep the dressing clean, dry and intact.  3. Keep your foot elevated above the heart level for the first 48 hours.  4. We have instructed you to be non-weight bearing.  5. Always wear your post-op shoe when walking.  Always use your crutches if you are to be non-weight bearing.  6. Do not take a shower. Baths are permissible as long as the foot is kept out of the water.   7. Every hour you are awake:  - Bend your knee 15 times.  8. Call [facility] [phone]) if any of the following problems occur: - You develop a temperature or fever. - The bandage becomes saturated with blood. - Medication does not stop your pain. - Injury of the foot occurs. - Any symptoms of infection including redness, odor, or red streaks running from wound.   AMBULATORY SURGERY  DISCHARGE INSTRUCTIONS   1) The drugs that you were given will stay in your system until tomorrow so for the next 24 hours you should not:  A) Drive an automobile B) Make any legal decisions C) Drink any alcoholic beverage   2) You may resume regular meals tomorrow.  Today it is better to start with liquids and gradually work up to solid foods.  You may eat anything you prefer, but it is better to start with liquids, then soup and crackers, and gradually work up to solid foods.   3) Please notify your doctor immediately if you have any unusual bleeding, trouble breathing, redness and pain at the surgery site, drainage, fever, or pain not relieved by medication.    4) Additional Instructions:        Please contact your physician with any problems or Same Day Surgery at [phone], Monday through Friday 6 am to 4 pm, or [place] at [facility]  number at [phone].   Bupivacaine Liposomal Suspension for Injection What is this medicine? BUPIVACAINE LIPOSOMAL (bue PIV a kane LIP oh som al) is an anesthetic. It causes loss of feeling in the skin or other tissues. It is used to prevent and to treat pain from some procedures. This medicine may be used for other purposes; ask your health care provider or pharmacist if you have questions. COMMON BRAND NAME(S): EXPAREL What should I tell my health care provider before I take this medicine? They need to know if you have any of these conditions:  G6PD deficiency  heart disease  kidney disease  liver disease  low blood pressure  lung or breathing disease, like asthma  an unusual or allergic reaction to bupivacaine, other medicines, foods, dyes, or preservatives  pregnant or trying to get pregnant  breast-feeding How should I use this medicine? This medicine is for injection into the affected area. It is given by a health care professional in a hospital or clinic setting. Talk to your pediatrician regarding the use of this medicine in children. Special care may be needed. Overdosage: If you think you have taken too much of this medicine contact a poison control center or emergency room at once. NOTE: This medicine is only for you. Do not share this medicine with others. What if I miss a dose? This does not apply. What may interact with this medicine? This medicine may interact  with the following medications:  acetaminophen  certain antibiotics like dapsone, nitrofurantoin, aminosalicylic acid, sulfonamides  certain medicines for seizures like phenobarbital, phenytoin, valproic acid  chloroquine  cyclophosphamide  flutamide  hydroxyurea  ifosfamide  metoclopramide  nitric oxide  nitroglycerin  nitroprusside  nitrous oxide  other local anesthetics like lidocaine, pramoxine, tetracaine  primaquine  quinine  rasburicase  sulfasalazine This list  may not describe all possible interactions. Give your health care provider a list of all the medicines, herbs, non-prescription drugs, or dietary supplements you use. Also tell them if you smoke, drink alcohol, or use illegal drugs. Some items may interact with your medicine. What should I watch for while using this medicine? Your condition will be monitored carefully while you are receiving this medicine. Be careful to avoid injury while the area is numb, and you are not aware of pain. What side effects may I notice from receiving this medicine? Side effects that you should report to your doctor or health care professional as soon as possible:  allergic reactions like skin rash, itching or hives, swelling of the face, lips, or tongue  seizures  signs and symptoms of a dangerous change in heartbeat or heart rhythm like chest pain; dizziness; fast, irregular heartbeat; palpitations; feeling faint or lightheaded; falls; breathing problems  signs and symptoms of methemoglobinemia such as pale, gray, or blue colored skin; headache; fast heartbeat; shortness of breath; feeling faint or lightheaded, falls; tiredness Side effects that usually do not require medical attention (report to your doctor or health care professional if they continue or are bothersome):  anxious  back pain  changes in taste  changes in vision  constipation  dizziness  fever  nausea, vomiting This list may not describe all possible side effects. Call your doctor for medical advice about side effects. You may report side effects to FDA at 1-800-FDA-1088. Where should I keep my medicine? This drug is given in a hospital or clinic and will not be stored at home. NOTE: This sheet is a summary. It may not cover all possible information. If you have questions about this medicine, talk to your doctor, pharmacist, or health care provider.  2020 Elsevier/Gold Standard (2019-05-31 10:48:23)

## 2020-08-17 NOTE — TOC Progression Note (Signed)
Transition of Care [facility]) - Progression Note    Patient Details  Name: Avant Printy. MRN: [id number] Date of Birth: 1961-03-22  [facility] of [facility]) CM/SW Contact  Anselm Pancoast, RN Phone Number: 08/17/2020, 10:26 AM  Clinical Narrative:    Notified by nurse patient in need of rolling walker before discharge. Confirmed patient is ready for discharge currently.   RN CM outreached to Mardene Celeste, [employer] requesting equipment and updated patient is ready for discharge once equipment arrives.         Expected Discharge Plan and Services           Expected Discharge Date: 08/17/20                                     Social Determinants of Health (SDOH) Interventions    Readmission Risk Interventions No flowsheet data found.

## 2020-08-17 NOTE — OR Nursing (Signed)
Discharge pending walker delivery to postop.

## 2020-08-17 NOTE — OR Nursing (Signed)
Walker delivered to pt in postop - d/c'd to home

## 2020-08-17 NOTE — H&P (Signed)
HISTORY AND PHYSICAL INTERVAL NOTE:  08/17/2020  7:16 AM  Richard Underwood.  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  The various methods of treatment have been discussed with the patient.  No guarantees were given.  After consideration of risks, benefits and other options for treatment, the patient has consented to surgery.  I have reviewed the patients' chart and labs.     A history and physical examination was performed in my office.  The patient was reexamined.  There have been no changes to this history and physical examination.  [name]

## 2020-08-17 NOTE — Anesthesia Preprocedure Evaluation (Signed)
Anesthesia Evaluation  Patient identified by MRN, date of birth, ID band Patient awake    Reviewed: Allergy & Precautions, NPO status   History of Anesthesia Complications Negative for: history of anesthetic complications  Airway Mallampati: II       Dental   Pulmonary neg pulmonary ROS, neg sleep apnea, neg COPD, Not current smoker,           Cardiovascular hypertension, Pt. on medications + CAD, + Past MI and + Cardiac Stents  (-) CHF (-) dysrhythmias (-) Valvular Problems/Murmurs     Neuro/Psych neg Seizures    GI/Hepatic Neg liver ROS, neg GERD  ,  Endo/Other  neg diabetesHypothyroidism (s/p partial thyroidectomy, on no meds now)   Renal/GU negative Renal ROS  negative genitourinary   Musculoskeletal   Abdominal   Peds negative pediatric ROS (+)  Hematology negative hematology ROS (+)   Anesthesia Other Findings Past Medical History: No date: Coronary artery disease No date: Family history of adverse reaction to anesthesia     Comment:   brother went into coma (hx aids weak immune system               chronic pneumonia) No date: Heart disease No date: History of kidney stones No date: Hypercholesteremia No date: Hypertension 07/25/2014: Hypothyroidism No date: NSTEMI (non-ST elevated myocardial infarction) (HCC) No date: Palpitations No date: Thyroid disease  Reproductive/Obstetrics                             Anesthesia Physical  Anesthesia Plan  ASA: III  Anesthesia Plan: General   Post-op Pain Management:    Induction: Intravenous  PONV Risk Score and Plan: 2 and Dexamethasone and Ondansetron  Airway Management Planned: Oral ETT  Additional Equipment:   Intra-op Plan:   Post-operative Plan: Extubation in OR  Informed Consent: I have reviewed the patients History and Physical, chart, labs and discussed the procedure including the risks, benefits and  alternatives for the proposed anesthesia with the patient or authorized representative who has indicated his/her understanding and acceptance.     Dental advisory given  Plan Discussed with: CRNA and Surgeon  Anesthesia Plan Comments:         Anesthesia Quick Evaluation

## 2020-08-17 NOTE — Anesthesia Procedure Notes (Signed)
Procedure Name: Intubation Date/Time: 08/17/2020 7:37 AM Performed by: Babs Sciara, CRNA Pre-anesthesia Checklist: Patient identified, Emergency Drugs available, Suction available, Patient being monitored and Timeout performed Patient Re-evaluated:Patient Re-evaluated prior to induction Oxygen Delivery Method: Circle system utilized Preoxygenation: Pre-oxygenation with 100% oxygen Induction Type: IV induction Ventilation: Mask ventilation with difficulty and Oral airway inserted - appropriate to patient size Laryngoscope Size: Mac and 4 Grade View: Grade II Tube type: Oral Tube size: 7.5 mm Number of attempts: 1 Airway Equipment and Method: Stylet and Oral airway Placement Confirmation: ETT inserted through vocal cords under direct vision,  positive ETCO2 and breath sounds checked- equal and bilateral Secured at: 23 cm Tube secured with: Tape Dental Injury: Teeth and Oropharynx as per pre-operative assessment  Future Recommendations: Recommend- induction with short-acting agent, and alternative techniques readily available

## 2020-08-17 NOTE — Op Note (Signed)
Operative note   Surgeon:Sameka Bagent    Assistant:None    Preop diagnosis: 1.  Right calcaneal exostosis 2.  Right Achilles insertional tendinitis    Postop diagnosis:Same    Procedure: 1.  Right calcaneal exostectomy 2.  Right Achilles tendon repair    EBL: Minimal    Anesthesia:local and general.  Local consisted of a one-to-one mixture of 0.25% bupivacaine with epinephrine and Exparel long-acting anesthetic.  A total of 20 cc was used    Hemostasis: Epinephrine infiltrated along the incision site    Specimen: Exostosis with Achilles insertional tendinitis    Complications: None    Operative indications:Richard Underwood. is an 59 y.o. that presents today for surgical intervention.  The risks/benefits/alternatives/complications have been discussed and consent has been given.    Procedure:  Patient was brought into the OR and placed on the operating table in theprone position. After anesthesia was obtained theright lower extremity was prepped and draped in usual sterile fashion.  Attention was directed to the posterior aspect of the right heel where a longitudinal incision was performed along the Achilles insertional site where the bony prominence was noted.  Sharp and blunt dissection carried down to the peritenon.  This was incised and reflected medially and laterally.  A longitudinal Achilles splitting incision was made.  Dissection was taken medial and lateral.  At this point is exposed a large exostosis at the Achilles insertional site.  With the use of a osteotome and a power rasp I was able to remove all of the exostosis and bony prominence in the area.  This was smoothed down to normal bony tissue.  The wound was flushed with copious amounts of irrigation.  Intraoperative fluoroscopy revealed good removal of bone and a smooth posterior calcaneus.  At this point the insertional Achilles was noted and debrided with a 15 blade.  All tendinitis was then excised.  It was  infiltrated with a Topaz wand.  The Achilles tendon was then placed directly on the posterior aspect of the heel with a 4 5 footprint bone anchor from [employer].  The Achilles was repaired with a Magnum wire into the bone anchor.  Final flush was performed.  Closure was then performed with a 3-0 Vicryl the deeper and subcutaneous tissue and a 4-0 Monocryl undyed for skin.  Patient was placed in neutral position in an equalizer walker boot.    Patient tolerated the procedure and anesthesia well.  Was transported from the OR to the PACU with all vital signs stable and vascular status intact. To be discharged per routine protocol.  Will follow up in approximately 1 week in the outpatient clinic.

## 2020-08-17 NOTE — Transfer of Care (Signed)
Immediate Anesthesia Transfer of Care Note  Patient: Richard Underwood.  Procedure(s) Performed: ACHILLES TENDON REPAIR RIGHT (Right ) OSTECTOMY-HAGLUNDS/RETROCALCANEAL (Right )  Patient Location: PACU  Anesthesia Type:General  Level of Consciousness: awake, alert  and oriented  Airway & Oxygen Therapy: Patient Spontanous Breathing and Patient connected to face mask oxygen  Post-op Assessment: Report given to RN and Post -op Vital signs reviewed and stable  Post vital signs: Reviewed and stable  Last Vitals:  Vitals Value Taken Time  BP 151/96 08/17/20 0908  Temp    Pulse 71 08/17/20 0920  Resp 11 08/17/20 0920  SpO2 99 % 08/17/20 0920  Vitals shown include unvalidated device data.  Last Pain:  Vitals:   08/17/20 0622  TempSrc: Tympanic  PainSc: 0-No pain         Complications: No complications documented.

## 2020-08-20 LAB — SURGICAL PATHOLOGY

## 2020-08-20 NOTE — Anesthesia Postprocedure Evaluation (Signed)
Anesthesia Post Note  Patient: Richard Underwood.  Procedure(s) Performed: ACHILLES TENDON REPAIR RIGHT (Right ) OSTECTOMY-HAGLUNDS/RETROCALCANEAL (Right )  Patient location during evaluation: PACU Anesthesia Type: General Level of consciousness: oriented and awake and alert Pain management: pain level controlled Vital Signs Assessment: post-procedure vital signs reviewed and stable Respiratory status: spontaneous breathing Cardiovascular status: blood pressure returned to baseline Anesthetic complications: no   No complications documented.   Last Vitals:  Vitals:   08/17/20 0959 08/17/20 1115  BP: (!) 144/71 128/67  Pulse: 70 65  Resp: 16 18  Temp: (!) 36 C   SpO2: 99% 96%    Last Pain:  Vitals:   08/20/20 0937  TempSrc:   PainSc: 0-No pain                 Naziah Portee

## 2020-09-07 ENCOUNTER — Other Ambulatory Visit: Payer: Self-pay | Admitting: Internal Medicine

## 2020-09-07 DIAGNOSIS — E782 Mixed hyperlipidemia: Secondary | ICD-10-CM

## 2021-01-19 ENCOUNTER — Other Ambulatory Visit: Payer: Self-pay | Admitting: Internal Medicine

## 2021-01-19 DIAGNOSIS — I10 Essential (primary) hypertension: Secondary | ICD-10-CM

## 2021-01-19 NOTE — Telephone Encounter (Signed)
Requested Prescriptions  Pending Prescriptions Disp Refills  . telmisartan-hydrochlorothiazide (MICARDIS HCT) 80-25 MG tablet [Pharmacy Med Name: TELMISA/HCTZ TAB 80-25MG ] 90 tablet 0    Sig: TAKE 1 TABLET DAILY     Cardiovascular: ARB + Diuretic Combos Passed - 01/19/2021  2:07 AM      Passed - K in normal range and within 180 days    Potassium  Date Value Ref Range Status  08/08/2020 4.4 3.5 - 5.2 mmol/L Final         Passed - Na in normal range and within 180 days    Sodium  Date Value Ref Range Status  08/08/2020 141 134 - 144 mmol/L Final         Passed - Cr in normal range and within 180 days    Creatinine, Ser  Date Value Ref Range Status  08/08/2020 1.04 0.76 - 1.27 mg/dL Final         Passed - Ca in normal range and within 180 days    Calcium  Date Value Ref Range Status  08/08/2020 9.6 8.6 - 10.2 mg/dL Final   Calcium, Ion  Date Value Ref Range Status  01/06/2020 1.12 (L) 1.15 - 1.40 mmol/L Final         Passed - Patient is not pregnant      Passed - Last BP in normal range    BP Readings from Last 1 Encounters:  08/17/20 128/67         Passed - Valid encounter within last 6 months    Recent Outpatient Visits          5 months ago Annual physical exam   [facility] Glean Hess, MD   1 year ago Mass of left testicle   [facility] Glean Hess, MD   1 year ago Annual physical exam   [facility] Glean Hess, MD   1 year ago Essential hypertension   [facility] Glean Hess, MD   2 years ago Essential hypertension   [facility] Glean Hess, MD      Future Appointments            In 2 weeks Army Melia Jesse Sans, MD [facility], [place]   In 6 months Army Melia, Jesse Sans, MD [facility], [facility]

## 2021-02-08 ENCOUNTER — Encounter: Payer: Self-pay | Admitting: Internal Medicine

## 2021-02-08 ENCOUNTER — Other Ambulatory Visit: Payer: Self-pay

## 2021-02-08 ENCOUNTER — Ambulatory Visit (INDEPENDENT_AMBULATORY_CARE_PROVIDER_SITE_OTHER): Payer: 59 | Admitting: Internal Medicine

## 2021-02-08 VITALS — BP 128/80 | HR 78 | Temp 97.9°F | Ht 68.0 in | Wt 242.0 lb

## 2021-02-08 DIAGNOSIS — D6869 Other thrombophilia: Secondary | ICD-10-CM | POA: Insufficient documentation

## 2021-02-08 DIAGNOSIS — I10 Essential (primary) hypertension: Secondary | ICD-10-CM

## 2021-02-08 DIAGNOSIS — E782 Mixed hyperlipidemia: Secondary | ICD-10-CM

## 2021-02-08 DIAGNOSIS — I240 Acute coronary thrombosis not resulting in myocardial infarction: Secondary | ICD-10-CM

## 2021-02-08 DIAGNOSIS — I259 Chronic ischemic heart disease, unspecified: Secondary | ICD-10-CM

## 2021-02-08 DIAGNOSIS — R7303 Prediabetes: Secondary | ICD-10-CM | POA: Insufficient documentation

## 2021-02-08 MED ORDER — ATORVASTATIN CALCIUM 80 MG PO TABS: 1.0000 | ORAL_TABLET | Freq: Every day | ORAL | 3 refills | Status: DC

## 2021-02-08 MED ORDER — TELMISARTAN-HCTZ 80-25 MG PO TABS: 1.0000 | ORAL_TABLET | Freq: Every day | ORAL | 3 refills | Status: DC

## 2021-02-08 NOTE — Progress Notes (Signed)
Date:  02/08/2021   Name:  Richard Underwood [facility].   DOB:  10-27-1960   MRN:  [id number]   Chief Complaint: Hypertension  Hypertension This is a chronic problem. The problem is controlled. Pertinent negatives include no chest pain, headaches, palpitations or shortness of breath. Past treatments include angiotensin blockers, beta blockers and diuretics. The current treatment provides significant improvement. Hypertensive end-organ damage includes CAD/MI. There is no history of chronic renal disease.  Hyperlipidemia This is a chronic problem. The problem is controlled. Recent lipid tests were reviewed and are normal. He has no history of chronic renal disease. Pertinent negatives include no chest pain, focal sensory loss, myalgias or shortness of breath. Current antihyperlipidemic treatment includes statins.  CAD - stable without CP or SOB; increasing exercise without problems.  On statin, beta blocker and Effient.  Lab Results  Component Value Date   CREATININE 1.04 08/08/2020   BUN 18 08/08/2020   NA 141 08/08/2020   K 4.4 08/08/2020   CL 101 08/08/2020   CO2 24 08/08/2020   Lab Results  Component Value Date   CHOL 166 08/08/2020   HDL 51 08/08/2020   LDLCALC 95 08/08/2020   TRIG 112 08/08/2020   CHOLHDL 3.3 08/08/2020   Lab Results  Component Value Date   TSH 2.120 07/26/2019   Lab Results  Component Value Date   HGBA1C 5.8 (H) 07/16/2018   Lab Results  Component Value Date   WBC 7.9 08/08/2020   HGB 14.6 08/08/2020   HCT 43.3 08/08/2020   MCV 83 08/08/2020   PLT 184 08/08/2020   Lab Results  Component Value Date   ALT 23 08/08/2020   AST 26 08/08/2020   ALKPHOS 98 08/08/2020   BILITOT 0.4 08/08/2020     Review of Systems  Constitutional:  Negative for fatigue and unexpected weight change.  HENT:  Negative for nosebleeds.   Eyes:  Negative for visual disturbance.  Respiratory:  Negative for cough, chest tightness, shortness of breath and wheezing.    Cardiovascular:  Negative for chest pain, palpitations and leg swelling.  Gastrointestinal:  Negative for abdominal pain, constipation and diarrhea.  Musculoskeletal:  Negative for arthralgias, gait problem, joint swelling and myalgias.  Neurological:  Negative for dizziness, weakness, light-headedness and headaches.  Psychiatric/Behavioral:  Negative for dysphoric mood and sleep disturbance. The patient is not nervous/anxious.    Patient Active Problem List   Diagnosis Date Noted   Digital mucous cyst of finger 08/08/2020   Tubular adenoma of colon 07/16/2018   Hypogonadism in male 11/11/2016   Ischemic heart disease due to coronary artery obstruction ([place]) 08/07/2014   Mixed hyperlipidemia 07/25/2014   Essential hypertension 07/25/2014   Palpitations 07/25/2014    Allergies  Allergen Reactions   Septra [Sulfamethoxazole-Trimethoprim]     Blistered on penis   Tetracyclines & Related Rash    Blister of skin    Past Surgical History:  Procedure Laterality Date   ACHILLES TENDON SURGERY Right 08/17/2020   Procedure: ACHILLES TENDON REPAIR RIGHT;  Surgeon: Samara Deist, DPM;  Location: [facility] ORS;  Service: Podiatry;  Laterality: Right;   COLONOSCOPY  08/2016   Physcian requested to do again in 3 years. Polyps were found.   CORONARY ANGIOPLASTY WITH STENT PLACEMENT  07/26/2014   stent placement LAD   OSTECTOMY Right 08/17/2020   Procedure: OSTECTOMY-HAGLUNDS/RETROCALCANEAL;  Surgeon: Samara Deist, DPM;  Location: [facility] ORS;  Service: Podiatry;  Laterality: Right;   SPERMATOCELECTOMY Left 01/06/2020   Procedure: SPERMATOCELECTOMY;  Surgeon: Billey Co, MD;  Location: [facility] ORS;  Service: Urology;  Laterality: Left;   THYROID SURGERY  2008   partial thyroidectomy for benign mass    Social History   Tobacco Use   Smoking status: Never   Smokeless tobacco: Never  Vaping Use   Vaping Use: Never used  Substance Use Topics   Alcohol use: Yes    Alcohol/week: 1.0 standard  drink    Types: 1 Cans of beer per week    Comment: occassionally   Drug use: No     Medication list has been reviewed and updated.  Current Meds  Medication Sig   aspirin EC 81 MG tablet Take 81 mg by mouth daily.    atorvastatin (LIPITOR) 80 MG tablet TAKE 1 TABLET DAILY   loratadine (CLARITIN) 10 MG tablet Take 10 mg by mouth daily.   metoprolol succinate (TOPROL-XL) 50 MG 24 hr tablet Take 50 mg by mouth daily. Take with or immediately following a meal.   Misc Natural Products (JOINT HEALTH PO) Take 2 tablets by mouth daily.   Multiple Vitamin (ONE DAILY) tablet Take 1 tablet by mouth daily.    prasugrel (EFFIENT) 10 MG TABS tablet Take 10 mg by mouth daily.   telmisartan-hydrochlorothiazide (MICARDIS HCT) 80-25 MG tablet TAKE 1 TABLET DAILY   [DISCONTINUED] atorvastatin (LIPITOR) 80 MG tablet Take 1 tablet by mouth daily.    PHQ 2/9 Scores 02/08/2021 08/08/2020 12/12/2019 07/26/2019  PHQ - 2 Score 0 0 0 0  PHQ- 9 Score 0 0 0 0    GAD 7 : Generalized Anxiety Score 02/08/2021 08/08/2020  Nervous, Anxious, on Edge 0 0  Control/stop worrying 0 0  Worry too much - different things 0 0  Trouble relaxing 0 0  Restless 0 0  Easily annoyed or irritable 0 0  Afraid - awful might happen 0 0  Total GAD 7 Score 0 0  Anxiety Difficulty - Not difficult at all    BP Readings from Last 3 Encounters:  02/08/21 128/80  08/17/20 128/67  08/08/20 138/86    Physical Exam Vitals and nursing note reviewed.  Constitutional:      General: He is not in acute distress.    Appearance: He is well-developed.  HENT:     Head: Normocephalic and atraumatic.  Neck:     Vascular: No carotid bruit.  Cardiovascular:     Rate and Rhythm: Normal rate and regular rhythm.     Pulses: Normal pulses.     Heart sounds: No murmur heard. Pulmonary:     Effort: Pulmonary effort is normal. No respiratory distress.  Musculoskeletal:        General: Normal range of motion.     Cervical back: Normal range  of motion.     Right lower leg: No edema.     Left lower leg: No edema.  Lymphadenopathy:     Cervical: No cervical adenopathy.  Skin:    General: Skin is warm and dry.     Findings: No lesion or rash.  Neurological:     Mental Status: He is alert and oriented to person, place, and time.  Psychiatric:        Mood and Affect: Mood normal.        Behavior: Behavior normal.    Wt Readings from Last 3 Encounters:  02/08/21 242 lb (109.8 kg)  08/17/20 238 lb (108 kg)  08/09/20 238 lb (108 kg)    BP 128/80   Pulse 78  Temp 97.9 F (36.6 C) (Oral)   Ht 5\' 8"  (1.727 m)   Wt 242 lb (109.8 kg)   SpO2 96%   BMI 36.80 kg/m   Assessment and Plan: 1. Essential hypertension Clinically stable exam with well controlled BP. Tolerating medications without side effects at this time. Pt to continue current regimen and low sodium diet; benefits of regular exercise as able discussed. No chest pain or SOB - sees cardiology once a year - telmisartan-hydrochlorothiazide (MICARDIS HCT) 80-25 MG tablet; Take 1 tablet by mouth daily.  Dispense: 90 tablet; Refill: 3  2. Mixed hyperlipidemia Tolerating statin medication without side effects at this time LDL is not at goal of < 70 on current dose.  Consider changing to Crestor Continue same therapy without change at this time. - atorvastatin (LIPITOR) 80 MG tablet; Take 1 tablet (80 mg total) by mouth daily.  Dispense: 90 tablet; Refill: 3  3. Prediabetes Noted several years ago. Encourage continued efforts at regular exercise; limit carbs and work on weight loss Will check A1C next visit  4. Ischemic heart disease due to coronary artery obstruction (HCC) Stable on medication Continue regular exercise and current medications Follow up with Cardiology annually  5. Acquired thrombophilia ([place]) On Effient without side effects   Partially dictated using Editor, commissioning. Any errors are unintentional.  Halina Maidens, MD [facility]  02/08/2021

## 2021-04-19 ENCOUNTER — Other Ambulatory Visit: Payer: Self-pay | Admitting: Internal Medicine

## 2021-04-19 DIAGNOSIS — I10 Essential (primary) hypertension: Secondary | ICD-10-CM

## 2021-04-19 NOTE — Telephone Encounter (Signed)
Requested medication (s) are due for refill today: no  Requested medication (s) are on the active medication list:  yes   Last refill:  01/25/2021  Future visit scheduled:  yes   Notes to clinic: Review for refills until appt  Failed protocol: K in normal range and within 180 days   Na in normal range and within 180 days   Cr in normal range and within 180 days   Ca in normal range and within 180 days    Requested Prescriptions  Pending Prescriptions Disp Refills   telmisartan-hydrochlorothiazide (MICARDIS HCT) 80-25 MG tablet [Pharmacy Med Name: TELMISA/HCTZ TAB 80-'25MG'$ ] 90 tablet 0    Sig: TAKE 1 TABLET DAILY     Cardiovascular: ARB + Diuretic Combos Failed - 04/19/2021  8:06 AM      Failed - K in normal range and within 180 days    Potassium  Date Value Ref Range Status  08/08/2020 4.4 3.5 - 5.2 mmol/L Final          Failed - Na in normal range and within 180 days    Sodium  Date Value Ref Range Status  08/08/2020 141 134 - 144 mmol/L Final          Failed - Cr in normal range and within 180 days    Creatinine, Ser  Date Value Ref Range Status  08/08/2020 1.04 0.76 - 1.27 mg/dL Final          Failed - Ca in normal range and within 180 days    Calcium  Date Value Ref Range Status  08/08/2020 9.6 8.6 - 10.2 mg/dL Final   Calcium, Ion  Date Value Ref Range Status  01/06/2020 1.12 (L) 1.15 - 1.40 mmol/L Final          Passed - Patient is not pregnant      Passed - Last BP in normal range    BP Readings from Last 1 Encounters:  02/08/21 128/80          Passed - Valid encounter within last 6 months    Recent Outpatient Visits           2 months ago Essential hypertension   [place], Laura H, MD   8 months ago Annual physical exam   [facility] Glean Hess, MD   1 year ago Mass of left testicle   [facility] Glean Hess, MD   1 year ago Annual physical exam   [facility] Glean Hess, MD   1 year ago Essential hypertension   [facility] Glean Hess, MD       Future Appointments             In 3 months Army Melia Jesse Sans, MD [facility], [facility]

## 2021-05-17 ENCOUNTER — Other Ambulatory Visit: Payer: Self-pay | Admitting: Internal Medicine

## 2021-05-17 DIAGNOSIS — I10 Essential (primary) hypertension: Secondary | ICD-10-CM

## 2021-05-17 DIAGNOSIS — E782 Mixed hyperlipidemia: Secondary | ICD-10-CM

## 2021-05-17 NOTE — Telephone Encounter (Signed)
Medication Refill - Medication: Telmisartan, Atorvastatin   Has the patient contacted their pharmacy? Yes.   PT states that he contacted pharmacy and that this medication had been denied. Please advise.  (Agent: If no, request that the patient contact the pharmacy for the refill.) (Agent: If yes, when and what did the pharmacy advise?)  Preferred Pharmacy (with phone number or street name):  [facility] [address] [facility] [address] [place] [address]  Phone: [phone] Fax: [phone]  Hours: Not open 24 hours   Has the patient been seen for an appointment in the last year OR does the patient have an upcoming appointment? Yes.    Agent: Please be advised that RX refills may take up to 3 business days. We ask that you follow-up with your pharmacy.

## 2021-05-18 NOTE — Telephone Encounter (Signed)
[facility] closed on weekends. Last RF for both meds 02/08/21 #90 3 RF-Needs to call on Monday

## 2021-05-20 MED ORDER — ATORVASTATIN CALCIUM 80 MG PO TABS: 80.0000 mg | ORAL_TABLET | Freq: Every day | ORAL | 0 refills | Status: DC

## 2021-05-20 MED ORDER — TELMISARTAN-HCTZ 80-25 MG PO TABS: 1.0000 | ORAL_TABLET | Freq: Every day | ORAL | 0 refills | Status: DC

## 2021-05-20 NOTE — Telephone Encounter (Signed)
Requested medication (s) are due for refill today:   No.  Rx for both meds 02/08/2021 #90 with 3 refills.   Unable to get through to [facility] to see why they won't give pt their refill.  Requested medication (s) are on the active medication list:   Yes for both  Future visit scheduled:   Yes in 2 mo.    Last ordered: 02/08/2021 #90, 3 refills.  Returned because unable to get in contact with [facility]. To clarify why they are refusing to fill this for the pt.   The recording says to get on the web site or call the number on the back of the member's card.  I've attempted 3 times to contact the pharmacy without success.  Requested Prescriptions  Pending Prescriptions Disp Refills   atorvastatin (LIPITOR) 80 MG tablet 90 tablet 3    Sig: Take 1 tablet (80 mg total) by mouth daily.     Cardiovascular:  Antilipid - Statins Passed - 05/17/2021  4:07 PM      Passed - Total Cholesterol in normal range and within 360 days    Cholesterol, Total  Date Value Ref Range Status  08/08/2020 166 100 - 199 mg/dL Final          Passed - LDL in normal range and within 360 days    LDL Chol Calc (NIH)  Date Value Ref Range Status  08/08/2020 95 0 - 99 mg/dL Final          Passed - HDL in normal range and within 360 days    HDL  Date Value Ref Range Status  08/08/2020 51 >39 mg/dL Final          Passed - Triglycerides in normal range and within 360 days    Triglycerides  Date Value Ref Range Status  08/08/2020 112 0 - 149 mg/dL Final          Passed - Patient is not pregnant      Passed - Valid encounter within last 12 months    Recent Outpatient Visits           3 months ago Essential hypertension   [facility] Glean Hess, MD   9 months ago Annual physical exam   [facility] Glean Hess, MD   1 year ago Mass of left testicle   [facility] Glean Hess, MD   1 year ago Annual physical exam   [facility]  Glean Hess, MD   2 years ago Essential hypertension   [facility] Glean Hess, MD       Future Appointments             In 2 months Glean Hess, MD [facility], [facility]             telmisartan-hydrochlorothiazide (MICARDIS HCT) 80-25 MG tablet 90 tablet 3    Sig: Take 1 tablet by mouth daily.     Cardiovascular: ARB + Diuretic Combos Failed - 05/17/2021  4:07 PM      Failed - K in normal range and within 180 days    Potassium  Date Value Ref Range Status  08/08/2020 4.4 3.5 - 5.2 mmol/L Final          Failed - Na in normal range and within 180 days    Sodium  Date Value Ref Range Status  08/08/2020 141 134 - 144 mmol/L Final  Failed - Cr in normal range and within 180 days    Creatinine, Ser  Date Value Ref Range Status  08/08/2020 1.04 0.76 - 1.27 mg/dL Final          Failed - Ca in normal range and within 180 days    Calcium  Date Value Ref Range Status  08/08/2020 9.6 8.6 - 10.2 mg/dL Final   Calcium, Ion  Date Value Ref Range Status  01/06/2020 1.12 (L) 1.15 - 1.40 mmol/L Final          Passed - Patient is not pregnant      Passed - Last BP in normal range    BP Readings from Last 1 Encounters:  02/08/21 128/80          Passed - Valid encounter within last 6 months    Recent Outpatient Visits           3 months ago Essential hypertension   [facility] Glean Hess, MD   9 months ago Annual physical exam   [facility] Glean Hess, MD   1 year ago Mass of left testicle   [facility] Glean Hess, MD   1 year ago Annual physical exam   [facility] Glean Hess, MD   2 years ago Essential hypertension   [facility] Glean Hess, MD       Future Appointments             In 2 months Army Melia Jesse Sans, MD [facility], [facility]

## 2021-08-14 ENCOUNTER — Other Ambulatory Visit: Payer: Self-pay

## 2021-08-14 ENCOUNTER — Encounter: Payer: Self-pay | Admitting: Internal Medicine

## 2021-08-14 ENCOUNTER — Ambulatory Visit (INDEPENDENT_AMBULATORY_CARE_PROVIDER_SITE_OTHER): Payer: 59 | Admitting: Internal Medicine

## 2021-08-14 VITALS — BP 136/88 | HR 80 | Ht 68.0 in | Wt 242.0 lb

## 2021-08-14 DIAGNOSIS — R7303 Prediabetes: Secondary | ICD-10-CM

## 2021-08-14 DIAGNOSIS — Z0000 Encounter for general adult medical examination without abnormal findings: Secondary | ICD-10-CM | POA: Diagnosis not present

## 2021-08-14 DIAGNOSIS — Z125 Encounter for screening for malignant neoplasm of prostate: Secondary | ICD-10-CM | POA: Diagnosis not present

## 2021-08-14 DIAGNOSIS — Z23 Encounter for immunization: Secondary | ICD-10-CM | POA: Diagnosis not present

## 2021-08-14 DIAGNOSIS — I10 Essential (primary) hypertension: Secondary | ICD-10-CM | POA: Diagnosis not present

## 2021-08-14 DIAGNOSIS — E782 Mixed hyperlipidemia: Secondary | ICD-10-CM | POA: Diagnosis not present

## 2021-08-14 DIAGNOSIS — D6869 Other thrombophilia: Secondary | ICD-10-CM

## 2021-08-14 MED ORDER — TELMISARTAN-HCTZ 80-25 MG PO TABS: 1.0000 | ORAL_TABLET | Freq: Every day | ORAL | 1 refills | Status: DC

## 2021-08-14 NOTE — Progress Notes (Signed)
Date:  08/14/2021   Name:  Richard Underwood [facility].   DOB:  Apr 12, 1961   MRN:  [id number]   Chief Complaint: Annual Exam Richard Underwood. is a 60 y.o. male who presents today for his Complete Annual Exam. He feels well. He reports exercising. He reports he is sleeping well.  He was seen by Cardiology this week - no medication changes were made.  Colonoscopy: 09/2019  Immunization History  Administered Date(s) Administered   Tdap 07/16/2018   Zoster Recombinat (Shingrix) 01/17/2019, 04/21/2019    Lab Results  Component Value Date   PSA1 0.9 08/08/2020   PSA1 0.8 07/26/2019   PSA1 1.1 07/16/2018     Hypertension This is a chronic problem. The problem is controlled. Pertinent negatives include no chest pain, headaches, palpitations or shortness of breath. Past treatments include angiotensin blockers, beta blockers and diuretics. The current treatment provides significant improvement. Hypertensive end-organ damage includes CAD/MI.  Hyperlipidemia The problem is controlled. Pertinent negatives include no chest pain, myalgias or shortness of breath. Current antihyperlipidemic treatment includes statins. The current treatment provides significant improvement of lipids.   Lab Results  Component Value Date   NA 141 08/08/2020   K 4.4 08/08/2020   CO2 24 08/08/2020   GLUCOSE 91 08/08/2020   BUN 18 08/08/2020   CREATININE 1.04 08/08/2020   CALCIUM 9.6 08/08/2020   GFRNONAA 77 08/08/2020   Lab Results  Component Value Date   CHOL 166 08/08/2020   HDL 51 08/08/2020   LDLCALC 95 08/08/2020   TRIG 112 08/08/2020   CHOLHDL 3.3 08/08/2020   Lab Results  Component Value Date   TSH 2.120 07/26/2019   Lab Results  Component Value Date   HGBA1C 5.8 (H) 07/16/2018   Lab Results  Component Value Date   WBC 7.9 08/08/2020   HGB 14.6 08/08/2020   HCT 43.3 08/08/2020   MCV 83 08/08/2020   PLT 184 08/08/2020   Lab Results  Component Value Date   ALT 23 08/08/2020   AST  26 08/08/2020   ALKPHOS 98 08/08/2020   BILITOT 0.4 08/08/2020   Lab Results  Component Value Date   VD25OH 42.8 08/08/2020     Review of Systems  Constitutional:  Negative for appetite change, chills, diaphoresis, fatigue and unexpected weight change.  HENT:  Negative for hearing loss, tinnitus, trouble swallowing and voice change.   Eyes:  Negative for visual disturbance.  Respiratory:  Negative for choking, shortness of breath and wheezing.   Cardiovascular:  Negative for chest pain, palpitations and leg swelling.  Gastrointestinal:  Positive for anal bleeding. Negative for abdominal pain, blood in stool, constipation and diarrhea.  Genitourinary:  Negative for difficulty urinating, dysuria, frequency and hematuria.  Musculoskeletal:  Negative for arthralgias, back pain and myalgias.  Skin:  Negative for color change and rash.  Neurological:  Negative for dizziness, syncope and headaches.  Hematological:  Negative for adenopathy. Bruises/bleeds easily.  Psychiatric/Behavioral:  Negative for dysphoric mood and sleep disturbance. The patient is not nervous/anxious.    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 Polyps were found.   CORONARY ANGIOPLASTY WITH STENT PLACEMENT  07/26/2014   stent placement LAD   OSTECTOMY Right 08/17/2020   Procedure: OSTECTOMY-HAGLUNDS/RETROCALCANEAL;  Surgeon: Samara Deist, DPM;  Location: [facility] ORS;  Service: Podiatry;  Laterality: Right;   SPERMATOCELECTOMY Left 01/06/2020   Procedure: SPERMATOCELECTOMY;  Surgeon: Billey Co, MD;  Location: [facility] ORS;  Service: Urology;  Laterality: Left;   THYROID SURGERY  2008   partial thyroidectomy for benign mass    Social History   Tobacco Use   Smoking status: Never   Smokeless tobacco: Never  Vaping Use   Vaping Use: Never used  Substance Use Topics   Alcohol use: Yes    Alcohol/week: 1.0 standard drink    Types: 1 Cans of beer per week    Comment: occassionally   Drug use: No     Medication list has been reviewed and updated.  Current Meds  Medication Sig   aspirin EC 81 MG tablet Take 81 mg by mouth daily.    atorvastatin (LIPITOR) 80 MG tablet Take 1 tablet (80 mg total) by mouth daily.   loratadine (CLARITIN) 10 MG tablet Take 10 mg by mouth daily.   metoprolol succinate (TOPROL-XL) 50 MG 24 hr tablet Take 50 mg by mouth daily. Take with or immediately following a meal.   Misc Natural Products (JOINT HEALTH PO) Take 2 tablets by mouth daily.   Multiple Vitamin (ONE DAILY) tablet Take 1 tablet by mouth daily.    prasugrel (EFFIENT) 10 MG TABS tablet Take 10 mg by mouth daily.   [DISCONTINUED] telmisartan-hydrochlorothiazide (MICARDIS HCT) 80-25 MG tablet Take 1 tablet by mouth daily.    PHQ 2/9 Scores 08/14/2021 02/08/2021 08/08/2020 12/12/2019  PHQ - 2 Score 0 0 0 0  PHQ- 9 Score 0 0 0 0    GAD 7 : Generalized Anxiety Score 08/14/2021 02/08/2021 08/08/2020  Nervous, Anxious, on Edge 0 0 0  Control/stop worrying 0 0 0  Worry too much - different things 0 0 0  Trouble relaxing 0 0 0  Restless 0 0 0  Easily annoyed or irritable 0 0 0  Afraid - awful  might happen 0 0 0  Total GAD 7 Score 0 0 0  Anxiety Difficulty Not difficult at all - Not difficult at all    BP Readings from Last 3 Encounters:  08/14/21 136/88  02/08/21 128/80  08/17/20 128/67    Physical Exam Vitals and nursing note reviewed.  Constitutional:      Appearance: Normal appearance. He is well-developed.  HENT:     Head: Normocephalic.     Right Ear: Tympanic membrane, ear canal and external ear normal.     Left Ear: Tympanic membrane, ear canal and external ear normal.     Nose: Nose normal.  Eyes:     Conjunctiva/sclera: Conjunctivae normal.     Pupils: Pupils are equal, round, and reactive to light.  Neck:     Thyroid: No thyromegaly.     Vascular: No carotid bruit.  Cardiovascular:     Rate and Rhythm: Normal rate and  regular rhythm.     Heart sounds: Normal heart sounds.  Pulmonary:     Effort: Pulmonary effort is normal.     Breath sounds: Normal breath sounds. No wheezing.  Chest:  Breasts:    Right: No mass.     Left: No mass.  Abdominal:     General: Bowel sounds are normal.     Palpations: Abdomen is soft.     Tenderness: There is no abdominal tenderness.  Musculoskeletal:        General: Normal range of motion.     Cervical back: Normal range of motion and neck supple.  Lymphadenopathy:     Cervical: No cervical adenopathy.  Skin:    General: Skin is warm and dry.  Neurological:     Mental Status: He is alert and oriented to person, place, and time.     Deep Tendon Reflexes: Reflexes are normal and symmetric.  Psychiatric:        Attention and Perception: Attention normal.        Mood and Affect: Mood normal.        Thought Content: Thought content normal.    Wt Readings from Last 3 Encounters:  08/14/21 242 lb (109.8 kg)  02/08/21 242 lb (109.8 kg)  08/17/20 238 lb (108 kg)    BP 136/88    Pulse 80    Ht 5\' 8"  (1.727 m)    Wt 242 lb (109.8 kg)    SpO2 97%    BMI 36.80 kg/m   Assessment and Plan: 1. Annual physical  exam Exam is normal except for weight. Encourage regular exercise and appropriate dietary changes. Up to date on screenings and immunizations.  2. Prostate cancer screening DRE deferred - PSA  3. Essential hypertension Clinically stable exam with well controlled BP. Tolerating medications without side effects at this time. Pt to continue current regimen and low sodium diet; benefits of regular exercise as able discussed. - CBC with Differential/Platelet - Comprehensive metabolic panel - telmisartan-hydrochlorothiazide (MICARDIS HCT) 80-25 MG tablet; Take 1 tablet by mouth daily.  Dispense: 90 tablet; Refill: 1  4. Mixed hyperlipidemia Goal LDL < 70, will change to Crestor if needed vs adding Zetia Patient prefers to try Crestor. - Lipid panel  5. Prediabetes Check labs and advise  Low carb diet recommended - Hemoglobin A1c  6. Need for vaccination for pneumococcus - Pneumococcal conjugate vaccine 20-valent  7. Acquired thrombophilia ([place]) With minor bleeding issues from medications   Partially dictated using Editor, commissioning. Any errors are unintentional.  Halina Maidens, MD [facility]  08/14/2021

## 2021-08-15 ENCOUNTER — Other Ambulatory Visit: Payer: Self-pay | Admitting: Internal Medicine

## 2021-08-15 DIAGNOSIS — E782 Mixed hyperlipidemia: Secondary | ICD-10-CM

## 2021-08-15 LAB — CBC WITH DIFFERENTIAL/PLATELET
Basophils Absolute: 0.1 10*3/uL (ref 0.0–0.2)
Basos: 1 %
EOS (ABSOLUTE): 0.2 10*3/uL (ref 0.0–0.4)
Eos: 3 %
Hematocrit: 43.4 % (ref 37.5–51.0)
Hemoglobin: 14.8 g/dL (ref 13.0–17.7)
Immature Grans (Abs): 0 10*3/uL (ref 0.0–0.1)
Immature Granulocytes: 0 %
Lymphocytes Absolute: 2.8 10*3/uL (ref 0.7–3.1)
Lymphs: 34 %
MCH: 27.5 pg (ref 26.6–33.0)
MCHC: 34.1 g/dL (ref 31.5–35.7)
MCV: 81 fL (ref 79–97)
Monocytes Absolute: 0.8 10*3/uL (ref 0.1–0.9)
Monocytes: 10 %
Neutrophils Absolute: 4.3 10*3/uL (ref 1.4–7.0)
Neutrophils: 52 %
Platelets: 211 10*3/uL (ref 150–450)
RBC: 5.38 x10E6/uL (ref 4.14–5.80)
RDW: 12.6 % (ref 11.6–15.4)
WBC: 8.3 10*3/uL (ref 3.4–10.8)

## 2021-08-15 LAB — LIPID PANEL
Chol/HDL Ratio: 4 ratio (ref 0.0–5.0)
Cholesterol, Total: 173 mg/dL (ref 100–199)
HDL: 43 mg/dL (ref >39)
LDL Chol Calc (NIH): 98 mg/dL (ref 0–99)
Triglycerides: 183 mg/dL — ABNORMAL HIGH (ref 0–149)
VLDL Cholesterol Cal: 32 mg/dL (ref 5–40)

## 2021-08-15 LAB — COMPREHENSIVE METABOLIC PANEL
ALT: 31 IU/L (ref 0–44)
AST: 36 IU/L (ref 0–40)
Albumin/Globulin Ratio: 1.5 (ref 1.2–2.2)
Albumin: 4.5 g/dL (ref 3.8–4.8)
Alkaline Phosphatase: 104 IU/L (ref 44–121)
BUN/Creatinine Ratio: 15 (ref 10–24)
BUN: 17 mg/dL (ref 8–27)
Bilirubin Total: 0.5 mg/dL (ref 0.0–1.2)
CO2: 26 mmol/L (ref 20–29)
Calcium: 9.6 mg/dL (ref 8.6–10.2)
Chloride: 98 mmol/L (ref 96–106)
Creatinine, Ser: 1.14 mg/dL (ref 0.76–1.27)
Globulin, Total: 3 g/dL (ref 1.5–4.5)
Glucose: 91 mg/dL (ref 70–99)
Potassium: 4.3 mmol/L (ref 3.5–5.2)
Sodium: 139 mmol/L (ref 134–144)
Total Protein: 7.5 g/dL (ref 6.0–8.5)
eGFR: 73 mL/min/{1.73_m2} (ref >59)

## 2021-08-15 LAB — PSA: Prostate Specific Ag, Serum: 1.1 ng/mL (ref 0.0–4.0)

## 2021-08-15 LAB — HEMOGLOBIN A1C
Est. average glucose Bld gHb Est-mCnc: 120 mg/dL
Hgb A1c MFr Bld: 5.8 % — ABNORMAL HIGH (ref 4.8–5.6)

## 2021-08-15 MED ORDER — ROSUVASTATIN CALCIUM 40 MG PO TABS: 40.0000 mg | ORAL_TABLET | Freq: Every day | ORAL | 3 refills | Status: DC

## 2022-01-13 IMAGING — US US SCROTUM W/ DOPPLER COMPLETE
1 series · 13 of 25 positions shown · non-contrast
Comparison: None.

CLINICAL DATA: Left testicular enlargement for several years

EXAM:
SCROTAL ULTRASOUND
DOPPLER ULTRASOUND OF THE TESTICLES
TECHNIQUE: Complete ultrasound examination of the testicles, epididymis, and
other scrotal structures was performed. Color and spectral Doppler
ultrasound were also utilized to evaluate blood flow to the
testicles.

[Series 1: us scrotum w/ doppler complete · 0.07mm/px · 13 of 58 slices shown]
[im 1/58]
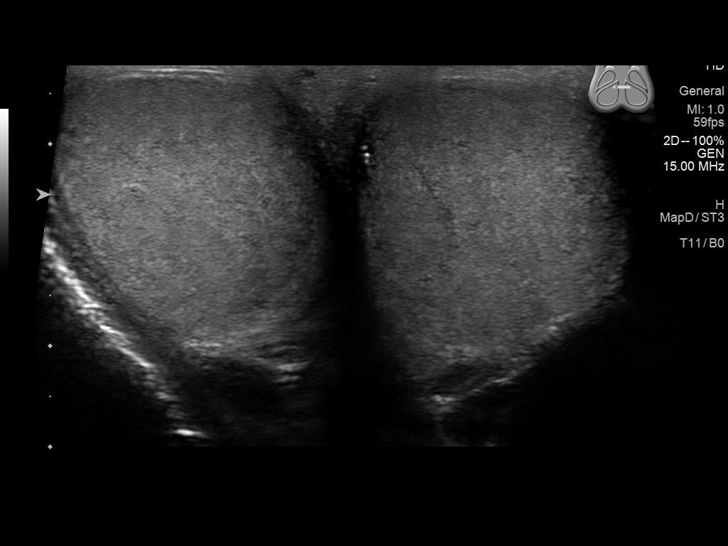
[im 5/58]
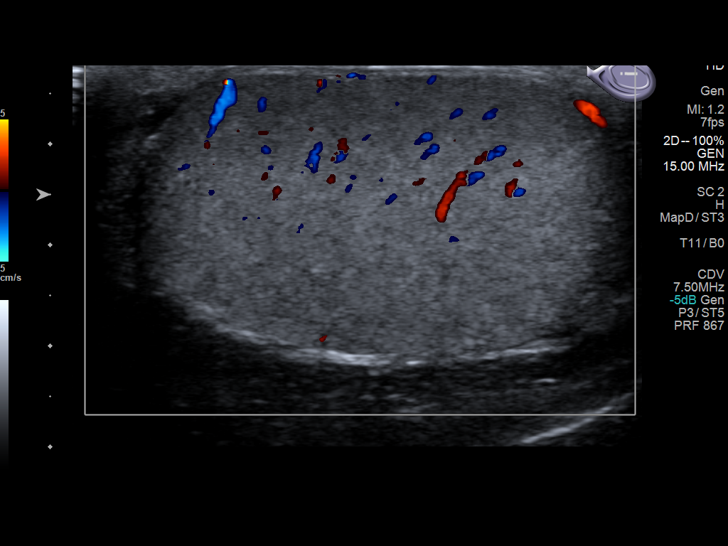
[im 10/58]
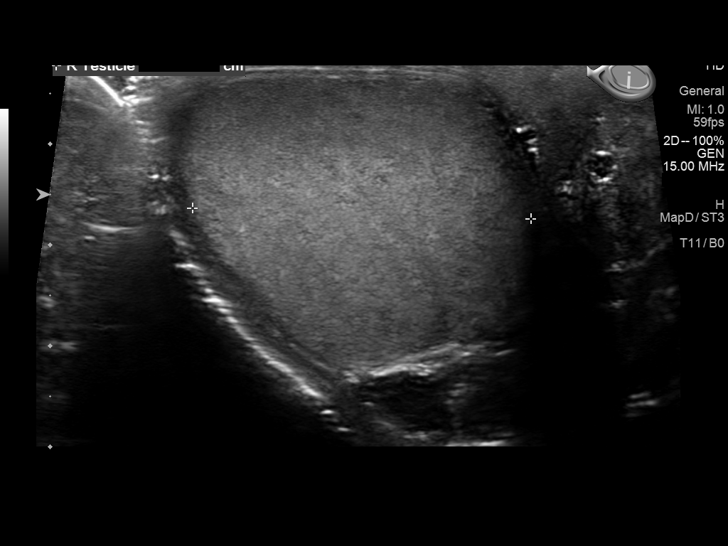
[im 15/58]
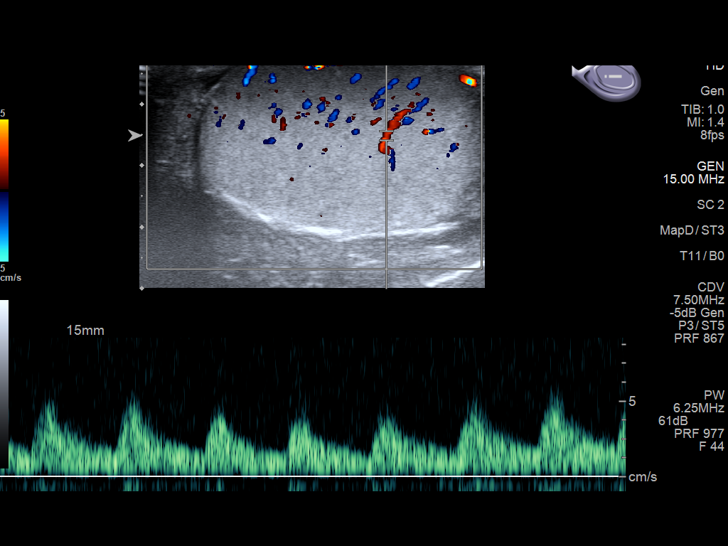
[im 20/58]
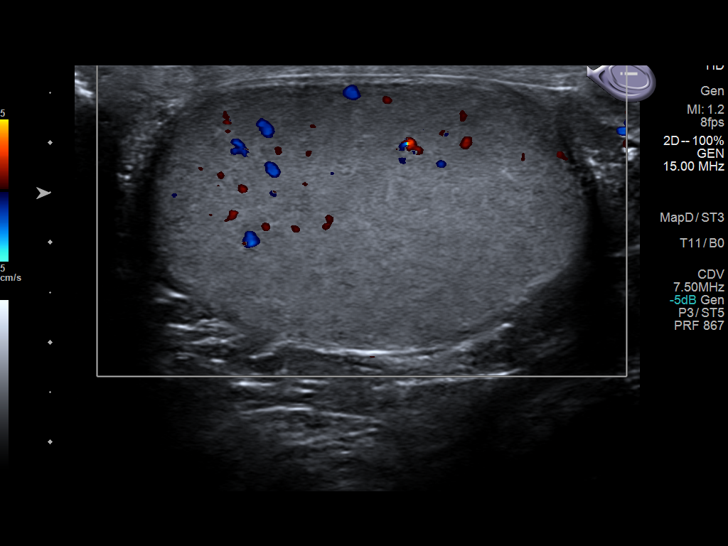
[im 24/58]
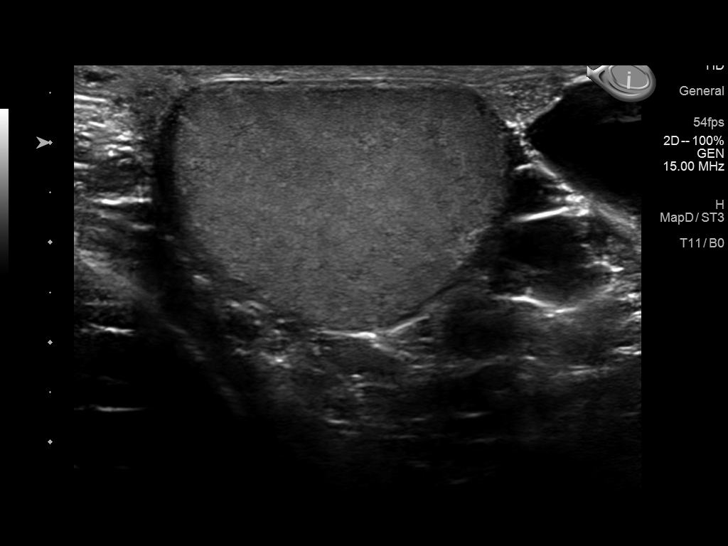
[im 29/58]
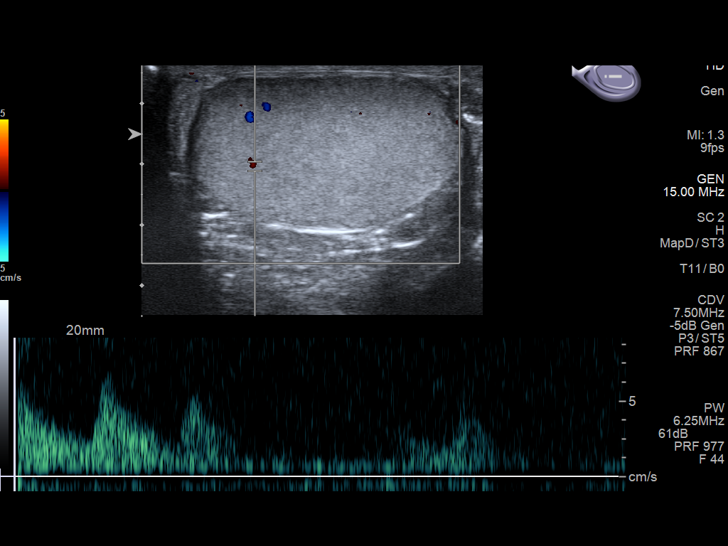
[im 34/58]
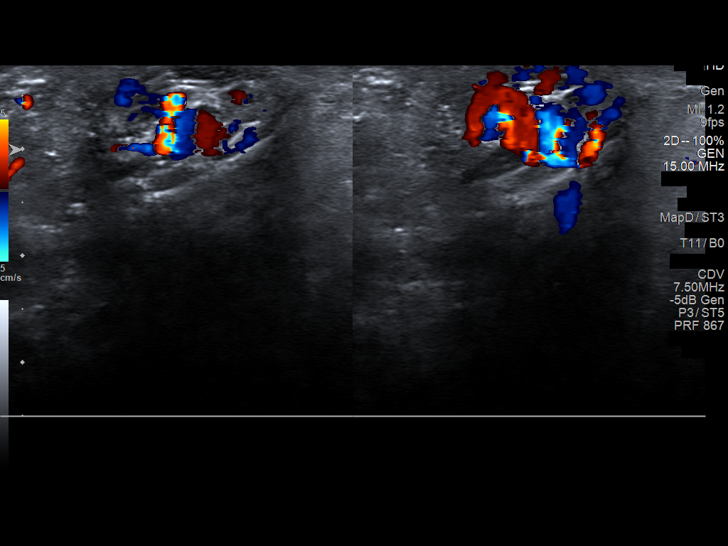
[im 39/58]
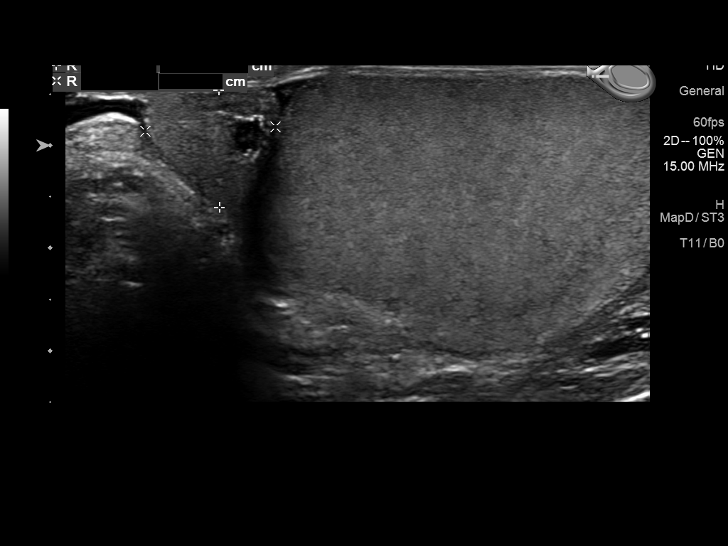
[im 43/58]
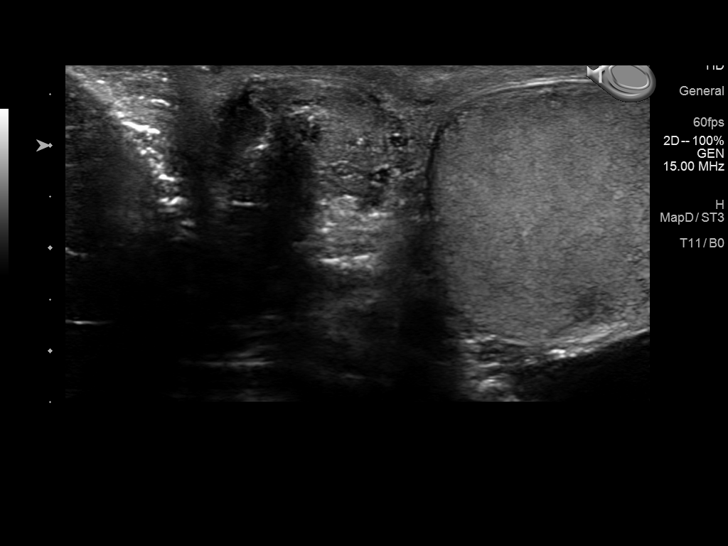
[im 48/58]
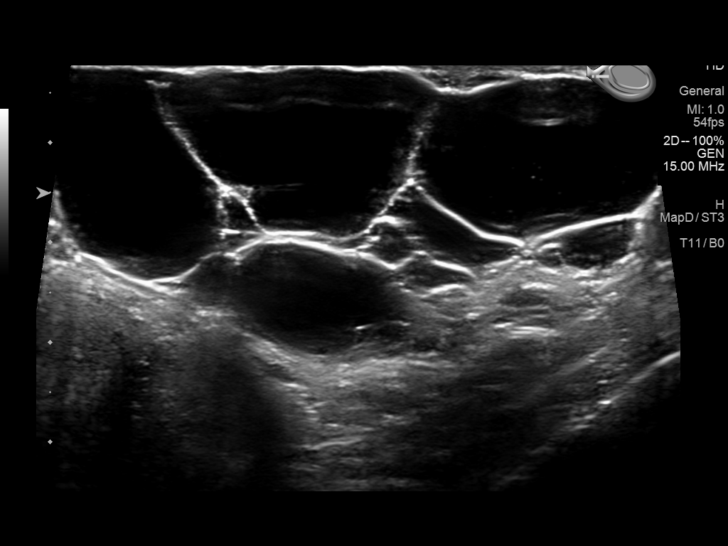
[im 53/58]
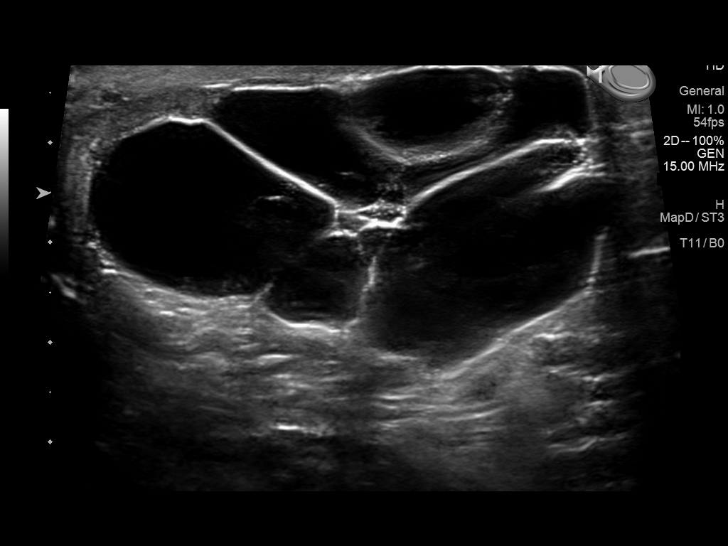
[im 58/58]
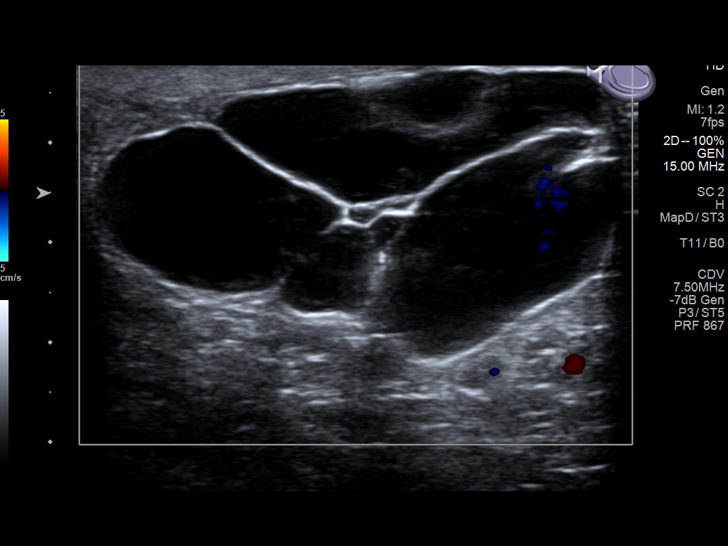

[13 of 25 positions shown; findings below may reference images not displayed]

FINDINGS: Right testicle

Measurements: 4.8 x 2.7 x 3.4 cm. No mass or microlithiasis
visualized.

Left testicle

Measurements: 4.4 x 2.5 x 3.3 cm. No mass or microlithiasis
visualized.

Right epididymis: Incidental 4 mm right epididymal head cyst.
Otherwise normal in size and appearance.

Left epididymis: Large multi-septated complex cyst in the region of
the left epididymal head measuring approximately 6.3 x 2.9 x 5.1 cm.
There are some low level internal echoes within the cystic spaces.
Node definite internal area of nodularity.

Hydrocele:  None visualized.

Varicocele:  None visualized.

Pulsed Doppler interrogation of both testes demonstrates normal low
resistance arterial and venous waveforms bilaterally.
IMPRESSION: 1. Large complex cystic mass in the region of the left epididymal
head measuring up to 6.3 cm. Imaging features are nonspecific.
Differential includes large complex epididymal cyst or spermatocele
versus an epididymal cystadenoma. Urology consultation is
recommended.
2. Negative for intratesticular mass or testicular torsion.

## 2022-02-12 ENCOUNTER — Ambulatory Visit: Payer: 59 | Admitting: Internal Medicine

## 2022-03-16 ENCOUNTER — Other Ambulatory Visit: Payer: Self-pay | Admitting: Internal Medicine

## 2022-03-16 DIAGNOSIS — I10 Essential (primary) hypertension: Secondary | ICD-10-CM

## 2022-03-18 NOTE — Telephone Encounter (Signed)
Requested Prescriptions  Pending Prescriptions Disp Refills  . telmisartan-hydrochlorothiazide (MICARDIS HCT) 80-25 MG tablet [Pharmacy Med Name: TELMISA/HCTZ TAB 80-25MG] 90 tablet 0    Sig: TAKE 1 TABLET DAILY     Cardiovascular: ARB + Diuretic Combos Failed - 03/16/2022  8:06 PM      Failed - K in normal range and within 180 days    Potassium  Date Value Ref Range Status  08/14/2021 4.3 3.5 - 5.2 mmol/L Final         Failed - Na in normal range and within 180 days    Sodium  Date Value Ref Range Status  08/14/2021 139 134 - 144 mmol/L Final         Failed - Cr in normal range and within 180 days    Creatinine, Ser  Date Value Ref Range Status  08/14/2021 1.14 0.76 - 1.27 mg/dL Final         Failed - eGFR is 10 or above and within 180 days    GFR calc Af Amer  Date Value Ref Range Status  08/08/2020 89 >59 mL/min/1.73 Final    Comment:    **In accordance with recommendations from the NKF-ASN Task force,**   [facility] is in the process of updating its eGFR calculation to the   2021 CKD-EPI creatinine equation that estimates kidney function   without a race variable.    GFR calc non Af Amer  Date Value Ref Range Status  08/08/2020 77 >59 mL/min/1.73 Final   eGFR  Date Value Ref Range Status  08/14/2021 73 >59 mL/min/1.73 Final         Failed - Valid encounter within last 6 months    Recent Outpatient Visits          7 months ago Annual physical exam   [facility] Glean Hess, MD   1 year ago Essential hypertension   [facility] Glean Hess, MD   1 year ago Annual physical exam   [facility] Glean Hess, MD   2 years ago Mass of left testicle   [facility] Glean Hess, MD   2 years ago Annual physical exam   [facility] Glean Hess, MD      Future Appointments            Tomorrow Glean Hess, MD [facility], [place]   In 5 months Glean Hess, MD [facility], [place] - Patient is not pregnant      Passed - Last BP in normal range    BP Readings from Last 1 Encounters:  08/14/21 136/88

## 2022-03-19 ENCOUNTER — Encounter: Payer: Self-pay | Admitting: Internal Medicine

## 2022-03-19 ENCOUNTER — Ambulatory Visit (INDEPENDENT_AMBULATORY_CARE_PROVIDER_SITE_OTHER): Payer: 59 | Admitting: Internal Medicine

## 2022-03-19 VITALS — BP 130/76 | HR 67 | Ht 68.0 in | Wt 242.0 lb

## 2022-03-19 DIAGNOSIS — K429 Umbilical hernia without obstruction or gangrene: Secondary | ICD-10-CM

## 2022-03-19 DIAGNOSIS — D6869 Other thrombophilia: Secondary | ICD-10-CM | POA: Diagnosis not present

## 2022-03-19 DIAGNOSIS — I10 Essential (primary) hypertension: Secondary | ICD-10-CM

## 2022-03-19 DIAGNOSIS — R7303 Prediabetes: Secondary | ICD-10-CM

## 2022-03-19 DIAGNOSIS — E782 Mixed hyperlipidemia: Secondary | ICD-10-CM

## 2022-03-19 NOTE — Progress Notes (Signed)
Date:  03/19/2022   Name:  Trini [facility].   DOB:  [birth year]   MRN:  [id number]   Chief Complaint: Hypertension and Hyperlipidemia  Hypertension This is a chronic problem. The problem is controlled. Pertinent negatives include no chest pain, headaches, palpitations or shortness of breath. Past treatments include angiotensin blockers, diuretics and beta blockers. There is no history of kidney disease, CAD/MI or CVA.  Diabetes He presents for his follow-up diabetic visit. Diabetes type: prediabetes. His disease course has been stable. Pertinent negatives for hypoglycemia include no dizziness or headaches. Pertinent negatives for diabetes include no chest pain, no fatigue and no weakness. Pertinent negatives for diabetic complications include no CVA. Current diabetic treatment includes diet. He is compliant with treatment most of the time.  Hyperlipidemia This is a chronic problem. The problem is resistant. Recent lipid tests were reviewed and are high. Pertinent negatives include no chest pain or shortness of breath. Current antihyperlipidemic treatment includes statins (Crestor 40 started last visit in place of atorvastatin).  Hernia - he has a small umbilical hernia that has been asymptomatic.  Last month it became larger but was not painful and it spontaneously reduced to previous size.  Lab Results  Component Value Date   NA 139 08/14/2021   K 4.3 08/14/2021   CO2 26 08/14/2021   GLUCOSE 91 08/14/2021   BUN 17 08/14/2021   CREATININE 1.14 08/14/2021   CALCIUM 9.6 08/14/2021   EGFR 73 08/14/2021   GFRNONAA 77 08/08/2020   Lab Results  Component Value Date   CHOL 173 08/14/2021   HDL 43 08/14/2021   LDLCALC 98 08/14/2021   TRIG 183 (H) 08/14/2021   CHOLHDL 4.0 08/14/2021   Lab Results  Component Value Date   TSH 2.120 07/26/2019   Lab Results  Component Value Date   HGBA1C 5.8 (H) 08/14/2021   Lab Results  Component Value Date   WBC 8.3 08/14/2021   HGB 14.8  08/14/2021   HCT 43.4 08/14/2021   MCV 81 08/14/2021   PLT 211 08/14/2021   Lab Results  Component Value Date   ALT 31 08/14/2021   AST 36 08/14/2021   ALKPHOS 104 08/14/2021   BILITOT 0.5 08/14/2021   Lab Results  Component Value Date   VD25OH 42.8 08/08/2020     Review of Systems  Constitutional:  Negative for fatigue and unexpected weight change.  HENT:  Negative for nosebleeds.   Eyes:  Negative for visual disturbance.  Respiratory:  Negative for cough, chest tightness, shortness of breath and wheezing.   Cardiovascular:  Negative for chest pain, palpitations and leg swelling.  Gastrointestinal:  Negative for abdominal pain, constipation and diarrhea.  Neurological:  Negative for dizziness, weakness, light-headedness and headaches.    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 Polyps were found.   CORONARY ANGIOPLASTY WITH STENT PLACEMENT  07/26/2014   stent placement LAD   OSTECTOMY Right 08/17/2020   Procedure: OSTECTOMY-HAGLUNDS/RETROCALCANEAL;  Surgeon: Samara Deist, DPM;  Location: [facility] OR[facility];  Service: Podiatry;  Laterality: Right;   SPERMATOCELECTOMY Left  01/06/2020   Procedure: SPERMATOCELECTOMY;  Surgeon: Billey Co, MD;  Location: [facility] OR[facility];  Service: Urology;  Laterality: Left;   THYROID SURGERY  2008   partial thyroidectomy for benign mass    Social History   Tobacco Use   Smoking status: Never   Smokeless tobacco: Never  Vaping Use   Vaping Use: Never used  Substance Use Topics   Alcohol use: Yes    Alcohol/week: 1.0 standard drink of alcohol    Types: 1 Cans of beer per week    Comment: occassionally   Drug use: No     Medication list has been reviewed and updated.  Current Meds  Medication Sig   aspirin EC 81 MG tablet Take 81 mg by mouth daily.    loratadine (CLARITIN) 10 MG tablet Take 10 mg by mouth daily.   metoprolol succinate (TOPROL-XL) 50 MG 24 hr tablet Take 50 mg by mouth daily. Take with or immediately following a meal.   Misc Natural Products (JOINT HEALTH PO) Take 2 tablets by mouth daily.   Multiple Vitamin (ONE DAILY) tablet Take 1 tablet by mouth daily.    prasugrel (EFFIENT) 10 MG TABS tablet Take 10 mg by mouth daily.   rosuvastatin (CRESTOR) 40 MG tablet Take 1 tablet (40 mg total) by mouth daily.   telmisartan-hydrochlorothiazide (MICARDIS HCT) 80-25 MG tablet TAKE 1 TABLET DAILY       08/14/2021    8:29 AM 02/08/2021    9:40 AM 08/08/2020    8:39 AM  GAD 7 : Generalized Anxiety Score  Nervous, Anxious, on Edge 0 0 0  Control/stop worrying 0 0 0  Worry too much - different things 0 0 0  Trouble relaxing 0 0 0  Restless 0 0 0  Easily annoyed or irritable 0 0 0  Afraid - awful might happen 0 0 0  Total GAD 7 Score 0 0 0  Anxiety Difficulty Not difficult at all  Not difficult at all       08/14/2021    8:28 AM 02/08/2021    9:40 AM 08/08/2020    8:39 AM  Depression screen PHQ 2/9  Decreased Interest 0 0 0  Down, Depressed, Hopeless 0 0 0  PHQ - 2 Score 0 0 0  Altered sleeping 0 0 0  Tired, decreased energy 0 0 0  Change in appetite 0 0 0  Feeling bad or failure about yourself  0  0 0  Trouble concentrating 0 0 0  Moving slowly or fidgety/restless 0 0 0  Suicidal thoughts 0 0 0  PHQ-9 Score 0 0 0  Difficult doing work/chores Not difficult at all Not difficult at all Not difficult at all    BP Readings from Last 3 Encounters:  03/19/22 130/76  08/14/21 136/88  02/08/21 128/80    Physical Exam Vitals and nursing note reviewed.  Constitutional:      General: He is not in acute distress.    Appearance: He is well-developed.  HENT:     Head: Normocephalic and atraumatic.  Cardiovascular:     Rate  and Rhythm: Normal rate and regular rhythm.  Pulmonary:     Effort: Pulmonary effort is normal. No respiratory distress.     Breath sounds: No wheezing or rhonchi.  Abdominal:     Hernia: A hernia is present. Hernia is present in the umbilical area (1 cm soft, non tender).    Musculoskeletal:     Cervical back: Normal range of motion.     Right lower leg: No edema.     Left lower leg: No edema.  Skin:    General: Skin is warm and dry.     Findings: No rash.  Neurological:     Mental Status: He is alert and oriented to person, place, and time.  Psychiatric:        Mood and Affect: Mood normal.        Behavior: Behavior normal.     Wt Readings from Last 3 Encounters:  03/19/22 242 lb (109.8 kg)  08/14/21 242 lb (109.8 kg)  02/08/21 242 lb (109.8 kg)    BP 130/76   Pulse 67   Ht _0  (1.727 m)   Wt 242 lb (109.8 kg)   SpO2 96%   BMI 36.80 kg/m   Assessment and Plan: 1. Essential hypertension Clinically stable exam with well controlled BP. Tolerating medications without side effects at this time. Pt to continue current regimen and low sodium diet; benefits of regular exercise as able discussed. - Comprehensive metabolic panel  2. Prediabetes Stable diet and weight Will recheck for worsening and advise - Comprehensive metabolic panel - Hemoglobin A1c  3. Mixed hyperlipidemia Tolerating high dose Crestor Continue current therapy -  Comprehensive metabolic panel - Lipid panel  4. Acquired thrombophilia ([place]) Due to Effient treatment  5. Umbilical hernia without obstruction and without gangrene Soft, reducible without concern at this time   Partially dictated using Dragon software. Any errors are unintentional.  Halina Maidens, MD [facility]  03/19/2022

## 2022-03-21 LAB — COMPREHENSIVE METABOLIC PANEL
ALT: 15 IU/L (ref 0–44)
AST: 22 IU/L (ref 0–40)
Albumin/Globulin Ratio: 1.8 (ref 1.2–2.2)
Albumin: 4.4 g/dL (ref 3.9–4.9)
Alkaline Phosphatase: 94 IU/L (ref 44–121)
BUN/Creatinine Ratio: 13 (ref 10–24)
BUN: 15 mg/dL (ref 8–27)
Bilirubin Total: 0.3 mg/dL (ref 0.0–1.2)
CO2: 26 mmol/L (ref 20–29)
Calcium: 9.7 mg/dL (ref 8.6–10.2)
Chloride: 101 mmol/L (ref 96–106)
Creatinine, Ser: 1.14 mg/dL (ref 0.76–1.27)
Globulin, Total: 2.5 g/dL (ref 1.5–4.5)
Glucose: 94 mg/dL (ref 70–99)
Potassium: 4.5 mmol/L (ref 3.5–5.2)
Sodium: 141 mmol/L (ref 134–144)
Total Protein: 6.9 g/dL (ref 6.0–8.5)
eGFR: 73 mL/min/{1.73_m2} (ref >59)

## 2022-03-21 LAB — LIPID PANEL
Chol/HDL Ratio: 3 ratio (ref 0.0–5.0)
Cholesterol, Total: 139 mg/dL (ref 100–199)
HDL: 47 mg/dL (ref >39)
LDL Chol Calc (NIH): 64 mg/dL (ref 0–99)
Triglycerides: 166 mg/dL — ABNORMAL HIGH (ref 0–149)
VLDL Cholesterol Cal: 28 mg/dL (ref 5–40)

## 2022-03-21 LAB — HEMOGLOBIN A1C
Est. average glucose Bld gHb Est-mCnc: 123 mg/dL
Hgb A1c MFr Bld: 5.9 % — ABNORMAL HIGH (ref 4.8–5.6)

## 2022-06-14 ENCOUNTER — Other Ambulatory Visit: Payer: Self-pay | Admitting: Internal Medicine

## 2022-06-14 DIAGNOSIS — I10 Essential (primary) hypertension: Secondary | ICD-10-CM

## 2022-06-16 NOTE — Telephone Encounter (Signed)
Requested Prescriptions  Pending Prescriptions Disp Refills  . telmisartan-hydrochlorothiazide (MICARDIS HCT) 80-25 MG tablet [Pharmacy Med Name: TELMISA/HCTZ TAB 80-25MG ] 90 tablet 0    Sig: TAKE 1 TABLET DAILY     Cardiovascular: ARB + Diuretic Combos Passed - 06/14/2022  2:19 AM      Passed - K in normal range and within 180 days    Potassium  Date Value Ref Range Status  03/20/2022 4.5 3.5 - 5.2 mmol/L Final         Passed - Na in normal range and within 180 days    Sodium  Date Value Ref Range Status  03/20/2022 141 134 - 144 mmol/L Final         Passed - Cr in normal range and within 180 days    Creatinine, Ser  Date Value Ref Range Status  03/20/2022 1.14 0.76 - 1.27 mg/dL Final         Passed - eGFR is 10 or above and within 180 days    GFR calc Af Amer  Date Value Ref Range Status  08/08/2020 89 >59 mL/min/1.73 Final    Comment:    **In accordance with recommendations from the NKF-ASN Task force,**   [facility] is in the process of updating its eGFR calculation to the   2021 CKD-EPI creatinine equation that estimates kidney function   without a race variable.    GFR calc non Af Amer  Date Value Ref Range Status  08/08/2020 77 >59 mL/min/1.73 Final   eGFR  Date Value Ref Range Status  03/20/2022 73 >59 mL/min/1.73 Final         Passed - Patient is not pregnant      Passed - Last BP in normal range    BP Readings from Last 1 Encounters:  03/19/22 130/76         Passed - Valid encounter within last 6 months    Recent Outpatient Visits          2 months ago Essential hypertension   [place] [facility], Jesse Sans, MD   10 months ago Annual physical exam   [facility] at [facility], Jesse Sans, MD   1 year ago Essential hypertension   [place] [facility] at [facility], Jesse Sans, MD   1 year ago Annual physical exam   [facility] and [facility] Medicine at [facility], Jesse Sans, MD   2 years ago Mass of left testicle   [facility] at [facility], Jesse Sans, MD      Future Appointments            In 2 months Army Melia, Jesse Sans, MD [place] [facility] at [facility]

## 2022-07-15 ENCOUNTER — Encounter: Payer: Self-pay | Admitting: Emergency Medicine

## 2022-07-15 ENCOUNTER — Ambulatory Visit (INDEPENDENT_AMBULATORY_CARE_PROVIDER_SITE_OTHER): Payer: 59

## 2022-07-15 ENCOUNTER — Ambulatory Visit
Admission: EM | Admit: 2022-07-15 | Discharge: 2022-07-15 | Disposition: A | Discharge status: Home / Self Care | Payer: 59 | Attending: Family Medicine | Admitting: Family Medicine

## 2022-07-15 ENCOUNTER — Telehealth: Payer: Self-pay | Admitting: Internal Medicine

## 2022-07-15 DIAGNOSIS — M25511 Pain in right shoulder: Secondary | ICD-10-CM

## 2022-07-15 DIAGNOSIS — W19XXXA Unspecified fall, initial encounter: Secondary | ICD-10-CM | POA: Diagnosis not present

## 2022-07-15 DIAGNOSIS — G8929 Other chronic pain: Secondary | ICD-10-CM

## 2022-07-15 NOTE — Discharge Instructions (Addendum)
You were seen today for shoulder pain.  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  I am concerned primarily for a tear/injury to the rotator cuff.  This is best seen on MRI, which we are unable to do here.   I recommend you either see your PCP for an MRI, or follow up with [facility] for further care.  You may call them at [phone].   You may take tylenol for pain at this time, and ice the area if needed.

## 2022-07-15 NOTE — Telephone Encounter (Signed)
Copied from [place] [phone]. Topic: Appointment Scheduling - Scheduling Inquiry for Clinic >> Jul 15, 2022  9:33 AM Cyndi Bender wrote: Reason for CRM: Pt stated he had a x-ray on his right shoulder due to not having full range of motion. Pt requests appt for MRI. Cb# [phone]

## 2022-07-15 NOTE — ED Provider Notes (Addendum)
[facility]    CSN: [id number] Arrival date & time: 07/15/22  0810      History   Chief Complaint Chief Complaint  Patient presents with   Fall   Shoulder Pain    right    HPI Richard Underwood. is a 61 y.o. male.   Patient is here for right shoulder pain.  He fell about 2 months ago.  He has been trying self PT.  It is much better, but certain things he cannot do and seems to be getting worse.  He was hiking, and was suddenly on the ground.  Heard "crackling" in the shoulder.  He felt like it may have dislocated and popped back in.  He had immediate pain.           Past Medical History:  Diagnosis Date   Coronary artery disease    Family history of adverse reaction to anesthesia     brother went into coma (hx aids weak immune system chronic pneumonia)   Heart disease    History of kidney stones    Hypercholesteremia    Hypertension    Hypothyroidism 07/25/2014   NSTEMI (non-ST elevated myocardial infarction) [facility])    Palpitations    Thyroid disease     Patient Active Problem List   Diagnosis Date Noted   Prediabetes 02/08/2021   Acquired thrombophilia ([place]) 02/08/2021   Digital mucous cyst of finger 08/08/2020   Tubular adenoma of colon 07/16/2018   Hypogonadism in male 11/11/2016   Ischemic heart disease due to coronary artery obstruction ([place]) 08/07/2014   Mixed hyperlipidemia 07/25/2014   Essential hypertension 07/25/2014   Palpitations 07/25/2014    Past Surgical History:  Procedure Laterality Date   ACHILLES TENDON SURGERY Right 08/17/2020   Procedure: ACHILLES TENDON REPAIR RIGHT;  Surgeon: Samara Deist, DPM;  Location: [facility] ORS;  Service: Podiatry;  Laterality: Right;   COLONOSCOPY  08/2016   Physcian requested to do again in 3 years. Polyps were found.   CORONARY ANGIOPLASTY WITH STENT PLACEMENT  07/26/2014   stent placement LAD   OSTECTOMY Right 08/17/2020   Procedure: OSTECTOMY-HAGLUNDS/RETROCALCANEAL;  Surgeon:  Samara Deist, DPM;  Location: [facility] ORS;  Service: Podiatry;  Laterality: Right;   SPERMATOCELECTOMY Left 01/06/2020   Procedure: SPERMATOCELECTOMY;  Surgeon: Billey Co, MD;  Location: [facility] ORS;  Service: Urology;  Laterality: Left;   THYROID SURGERY  2008   partial thyroidectomy for benign mass       Home Medications    Prior to Admission medications   Medication Sig Start Date End Date Taking? Authorizing Provider  aspirin EC 81 MG tablet Take 81 mg by mouth daily.     [provider]  loratadine (CLARITIN) 10 MG tablet Take 10 mg by mouth daily.    [provider]  metoprolol succinate (TOPROL-XL) 50 MG 24 hr tablet Take 50 mg by mouth daily. Take with or immediately following a meal.    [provider]  Misc Natural Products (JOINT HEALTH PO) Take 2 tablets by mouth daily.    [provider]  Multiple Vitamin (ONE DAILY) tablet Take 1 tablet by mouth daily.     [provider]  prasugrel (EFFIENT) 10 MG TABS tablet Take 10 mg by mouth daily.    [provider]  rosuvastatin (CRESTOR) 40 MG tablet Take 1 tablet (40 mg total) by mouth daily. 08/15/21   Glean Hess, MD  telmisartan-hydrochlorothiazide (MICARDIS HCT) 80-25 MG tablet TAKE 1 TABLET DAILY  06/16/22   Glean Hess, MD    Family History Family History  Problem Relation Age of Onset   Diabetes Mother    Hypertension Mother    Diabetes Maternal Grandmother    Diabetes Maternal Grandfather     Social History Social History   Tobacco Use   Smoking status: Never   Smokeless tobacco: Never  Vaping Use   Vaping Use: Never used  Substance Use Topics   Alcohol use: Yes    Alcohol/week: 1.0 standard drink of alcohol    Types: 1 Cans of beer per week    Comment: occassionally   Drug use: No     Allergies   Septra [sulfamethoxazole-trimethoprim] and Tetracyclines & related   Review of Systems Review of Systems  Constitutional: Negative.    HENT: Negative.    Respiratory: Negative.    Cardiovascular: Negative.   Gastrointestinal: Negative.   Genitourinary: Negative.   Musculoskeletal:        See hpi     Physical Exam Triage Vital Signs ED Triage Vitals  Enc Vitals Group     BP 07/15/22 0848 (!) 144/87     Pulse Rate 07/15/22 0848 74     Resp 07/15/22 0848 16     Temp 07/15/22 0848 98.3 F (36.8 C)     Temp Source 07/15/22 0848 Oral     SpO2 07/15/22 0848 98 %     Weight 07/15/22 0847 240 lb (108.9 kg)     Height 07/15/22 0847 '5\' 8"'$  (1.727 m)     Head Circumference --      Peak Flow --      Pain Score 07/15/22 0846 0     Pain Loc --      Pain Edu? --      Excl. in [place]? --    No data found.  Updated Vital Signs BP (!) 144/87 (BP Location: Left Arm)   Pulse 74   Temp 98.3 F (36.8 C) (Oral)   Resp 16   Ht '5\' 8"'$  (1.727 m)   Wt 108.9 kg   SpO2 98%   BMI 36.49 kg/m   Visual Acuity Right Eye Distance:   Left Eye Distance:   Bilateral Distance:    Right Eye Near:   Left Eye Near:    Bilateral Near:     Physical Exam Constitutional:      Appearance: Normal appearance.  Musculoskeletal:     Comments: No deformity to the shoulder noted.  No TTP to the posterior, superior, anterior shoulder;  Pain with active movement of the shoulder with elevation and abduction;  decreased strength to the right shoulder compared to the left  Skin:    General: Skin is warm.  Neurological:     General: No focal deficit present.     Mental Status: He is alert.  Psychiatric:        Mood and Affect: Mood normal.      UC Treatments / Results  Labs (all labs ordered are listed, but only abnormal results are displayed) Labs Reviewed - No data to display  EKG   Radiology No results found.  Procedures Procedures (including critical care time)  Medications Ordered in UC Medications - No data to display  Initial Impression / Assessment and Plan / UC Course  I have reviewed the triage vital signs and the  nursing notes.  Pertinent labs & imaging results that were available during my care of the patient were reviewed by me and considered in  my medical decision making (see chart for details).   Final Clinical Impressions(s) / UC Diagnoses   Final diagnoses:  Chronic right shoulder pain  Fall, initial encounter     Discharge Instructions      You were seen today for shoulder pain.  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  I am concerned primarily for a tear/injury to the rotator cuff.  This is best seen on MRI, which we are unable to do here.   I recommend you either see your PCP for an MRI, or follow up with [facility] for further care.  You may call them at [phone].   You may take tylenol for pain at this time, and ice the area if needed.     ED Prescriptions   None    PDMP not reviewed this encounter.   Rondel Oh, MD 07/15/22 [place], MD 07/15/22 [phone]

## 2022-07-15 NOTE — ED Triage Notes (Signed)
Pt c/o right shoulder pain. He states he fell while hiking about a week ago and the pain has been getting worse. He states he has pain when lifting his arm and hold things up with his right arm.

## 2022-07-16 NOTE — Telephone Encounter (Signed)
Could not leave message.

## 2022-07-28 ENCOUNTER — Other Ambulatory Visit: Payer: Self-pay | Admitting: Internal Medicine

## 2022-07-28 DIAGNOSIS — E782 Mixed hyperlipidemia: Secondary | ICD-10-CM

## 2022-07-29 NOTE — Telephone Encounter (Signed)
Requested Prescriptions  Pending Prescriptions Disp Refills   rosuvastatin (CRESTOR) 40 MG tablet [Pharmacy Med Name: ROSUVASTATIN TAB '40MG'$ ] 90 tablet 2    Sig: TAKE 1 TABLET DAILY     Cardiovascular:  Antilipid - Statins 2 Failed - 07/28/2022  2:14 AM      Failed - Lipid Panel in normal range within the last 12 months    Cholesterol, Total  Date Value Ref Range Status  03/20/2022 139 100 - 199 mg/dL Final   LDL Chol Calc (NIH)  Date Value Ref Range Status  03/20/2022 64 0 - 99 mg/dL Final   HDL  Date Value Ref Range Status  03/20/2022 47 >39 mg/dL Final   Triglycerides  Date Value Ref Range Status  03/20/2022 166 (H) 0 - 149 mg/dL Final         Passed - Cr in normal range and within 360 days    Creatinine, Ser  Date Value Ref Range Status  03/20/2022 1.14 0.76 - 1.27 mg/dL Final         Passed - Patient is not pregnant      Passed - Valid encounter within last 12 months    Recent Outpatient Visits           4 months ago Essential hypertension   [place] [facility] at [facility], Jesse Sans, MD   11 months ago Annual physical exam   [place] [facility] at [facility], Jesse Sans, MD   1 year ago Essential hypertension   [place] [facility] at [facility], Jesse Sans, MD   1 year ago Annual physical exam   [facility] at [facility], Jesse Sans, MD   2 years ago Mass of left testicle   [place] [facility] at [facility], Jesse Sans, MD       Future Appointments             In 2 weeks [place] Melia, Jesse Sans, MD [place] [facility] at [facility], [facility]

## 2022-08-04 ENCOUNTER — Other Ambulatory Visit: Payer: Self-pay | Admitting: Orthopedic Surgery

## 2022-08-04 ENCOUNTER — Other Ambulatory Visit: Payer: Self-pay

## 2022-08-04 ENCOUNTER — Encounter: Payer: Self-pay | Admitting: Orthopedic Surgery

## 2022-08-04 ENCOUNTER — Encounter
Admission: RE | Admit: 2022-08-04 | Discharge: 2022-08-04 | Disposition: A | Discharge status: Home / Self Care | Payer: 59 | Source: Ambulatory Visit | Attending: Orthopedic Surgery | Admitting: Orthopedic Surgery

## 2022-08-04 HISTORY — DX: Prediabetes: R73.03

## 2022-08-04 NOTE — Progress Notes (Signed)
Perioperative Services  Pre-Admission/Anesthesia Testing Clinical Review  Date: 08/05/22  Patient Demographics:  Name: Richard Underwood. DOB:   December 18, 1960 MRN:   [id number]  Planned Surgical Procedure(s):    Case: [id number] Date/Time: 08/07/22 1117   Procedure: SHOULDER ARTHROSCOPY WITH OPEN ROTATOR CUFF REPAIR AND DISTAL CLAVICLE ACROMIONECTOMY (Left)   Anesthesia type: Choice   Pre-op diagnosis: Right Shoulder Rotator Cuff Tear   Location: [facility] OR ROOM 02 / [place] [facility]   Surgeons: Thornton Park, MD   NOTE: Available PAT nursing documentation and vital signs have been reviewed. Clinical nursing staff has updated patient's PMH/PSHx, current medication list, and drug allergies/intolerances to ensure comprehensive history available to assist in medical decision making as it pertains to the aforementioned surgical procedure and anticipated anesthetic course. Extensive review of available clinical information performed. Richard Underwood PMH and PSHx updated with any diagnoses/procedures that  may have been inadvertently omitted during his intake with the pre-admission testing department's nursing staff.  Clinical Discussion:  Richard Underwood. is a 61 y.o. male who is submitted for pre-surgical anesthesia review and clearance prior to him undergoing the above procedure. Patient has never been a smoker. Pertinent PMH includes: CAD, NSTEMI, ischemic heart disease, RBBB, palpitations, HTN, HLD, prediabetes, hypothyroidism, LEFT rotator cuff arthropathy.  Patient is followed by cardiology Bobby Rumpf, ANP-C). He was last seen in the cardiology clinic on 07/30/2022; notes reviewed. At the time of his clinic visit, the patient denied any chest pain, shortness of breath, PND, orthopnea, palpitations, significant peripheral edema, vertiginous symptoms, or presyncope/syncope.  Patient with a PMH significant for cardiovascular diagnoses.  Patient suffered an NSTEMI on 07/25/2014.    TTE performed on 07/25/2014 revealed normal left ventricular systolic function with an EF of 60 to 65%.  There was mild LVH.  Diastolic Doppler parameters normal.  Right ventricular size and function normal.  There was degenerative mitral valve disease noted.  Trivial tricuspid valve regurgitation observed.  There was no evidence of any significant transvalvular gradient suggestive of valvular stenosis.  Myocardial PET perfusion scan performed on 07/25/2014 that demonstrated a large sized, severe, nearly completely reversible defect involving the apical septal, apical anterior, apical, mid anteroseptal, mid anterior, and basal anteroseptal segments consistent with ischemia.  Mild right ventricular prominence noted.  EF normal at 63%.  Diagnostic LEFT heart catheterization was performed on 07/26/2014 revealing a single 95% lesion to the proximal LAD.  Subsequent PCI performed placing a 3.0 x 15 mm Integrity Resolute DES x 1 to the proximal LAD lesion yielding excellent angiographic result and TIMI-3 flow.  Blood pressure well-controlled at 123/77 mmHg on currently prescribed beta-blocker (metoprolol succinate), ARB (telmisartan), and diuretic (HCTZ) therapies.  Patient is on rosuvastatin for his HLD diagnosis and further ASCVD prevention.  He has a prediabetes diagnosis. Last HgbA1c was well-controlled at 5.9% when checked on 03/20/2022.  Following retirement from his job, patient remains active.  He just returned from [place] and August of this year, but while there fell and injured his rotator cuff, thus giving rise to the need for upcoming elective orthopedic surgery.  Patient able to achieve >4 METS of physical activity without experiencing any degree of angina/anginal equivalent symptoms.  No changes were made to his medication regimen.  Patient to follow-up with outpatient cardiology in 1 year or sooner if needed.  Richard Rossetti Caleen Jobs. is scheduled for an elective LEFT SHOULDER ARTHROSCOPY WITH  OPEN ROTATOR CUFF REPAIR AND DISTAL CLAVICLE ACROMIONECTOMY ) on 08/07/2022 with Dr. Thornton Park,  MD.  Given patient's past medical history significant for cardiovascular diagnoses, presurgical cardiac clearance was sought by the PAT team.  Per cardiology, "preoperative assessment would place patient at a LOW to MODERATE risk given his history of CAD.  He is currently without symptoms of ischemia, arrhythmia, or heart failure.  ECG notes normal sinus rhythm with a RBBB, which is unchanged from previous.  No further workup is needed".  Again, this patient is on daily DAPT therapy.  He has been instructed on recommendations for holding his prasugrel for 7 days prior to his procedure with plans to restart as soon as postoperative bleeding risk felt to be minimized by his attending surgeon. The patient has been instructed that his last dose of his prasugrel should be on 07/30/2022.  Patient will continue his daily low-dose ASA throughout his perioperative course.  Patient denies previous perioperative complications with anesthesia in the past.  He did want to make mention of the fact that his brother went into a "coma" following surgery.  When asked for further context, he noted that his brother had a history of AIDS causing him to have a weak immune system and chronic pneumonia.  In review of the available records, it is noted that patient underwent a general anesthetic course here at [facility] ([facility]) in 08/2020 without documented complications.      08/04/2022    9:29 AM 07/15/2022    8:48 AM 07/15/2022    8:47 AM  Vitals with BMI  Height 5' 8"  5' 8"  Weight 240 lbs  240 lbs  BMI 41.9  37.9  Systolic  024   Diastolic  87   Pulse  74     Providers/Specialists:   NOTE: Primary physician provider listed below. Patient may have been seen by APP or partner within same practice.   PROVIDER ROLE / SPECIALTY LAST Larey Seat, MD Orthopedics (Surgeon)  ???  Glean Hess, MD Primary Care Provider 03/19/2022  Priscella Mann, [facility] 07/30/2022   Allergies:  Septra [sulfamethoxazole-trimethoprim] and Tetracyclines & related  Current Home Medications:   No current facility-administered medications for this encounter.    aspirin EC 81 MG tablet   loratadine (CLARITIN) 10 MG tablet   metoprolol succinate (TOPROL-XL) 50 MG 24 hr tablet   Misc Natural Products (JOINT HEALTH PO)   Multiple Vitamin (ONE DAILY) tablet   prasugrel (EFFIENT) 10 MG TABS tablet   rosuvastatin (CRESTOR) 40 MG tablet   telmisartan-hydrochlorothiazide (MICARDIS HCT) 80-25 MG tablet   History:   Past Medical History:  Diagnosis Date   Coronary artery disease 07/2014   a.) LHC/PCI 07/26/2014: 95% pLAD (3.0 x 15 mm Integrity Resolute DES)   Family history of adverse reaction to anesthesia     brother went into coma (hx aids weak immune system chronic pneumonia)   History of kidney stones    Hypercholesteremia    Hypertension    Hypothyroidism 07/25/2014   a.) s/p partial thyroidectomy   Ischemic heart disease due to coronary artery obstruction ([place])    Long term current use of antithrombotics/antiplatelets    a.) on DAPT (ASA + prasugrel)   NSTEMI (non-ST elevated myocardial infarction) ([place]) 07/25/2014   a.) LHC/PCI 07/26/2014: 95% pLAD (3.0 x 15 mm Integrity Resolute DES)   Palpitations    Pre-diabetes    RBBB (right bundle branch block)    Rotator cuff arthropathy, left    Spermatocele    a.) s/p LEFT spermatocelectomy  01/06/2020   Thyroid mass 2008   a.) s/p partial thyroidectomy; pathology benign   Past Surgical History:  Procedure Laterality Date   ACHILLES TENDON SURGERY Right 08/17/2020   Procedure: ACHILLES TENDON REPAIR RIGHT;  Surgeon: Samara Deist, DPM;  Location: [facility] ORS;  Service: Podiatry;  Laterality: Right;   COLONOSCOPY  08/2016   Physcian requested to do again in 3 years. Polyps were found.   CORONARY ANGIOPLASTY  WITH STENT PLACEMENT  07/26/2014   Procedure: CORONARY ANGIOPLASTY WITH STENT PLACEMENT; Location: [facility]; Surgeon: Jacques Earthly, MD   OSTECTOMY Right 08/17/2020   Procedure: OSTECTOMY-HAGLUNDS/RETROCALCANEAL;  Surgeon: Samara Deist, DPM;  Location: [facility] OR[facility];  Service: Podiatry;  Laterality: Right;   SPERMATOCELECTOMY Left 01/06/2020   Procedure: SPERMATOCELECTOMY;  Surgeon: Billey Co, MD;  Location: [facility] ORS;  Service: Urology;  Laterality: Left;   THYROIDECTOMY, PARTIAL  2008   Family History  Problem Relation Age of Onset   Diabetes Mother    Hypertension Mother    Diabetes Maternal Grandmother    Diabetes Maternal Grandfather    Social History   Tobacco Use   Smoking status: Never   Smokeless tobacco: Never  Vaping Use   Vaping Use: Never used  Substance Use Topics   Alcohol use: Yes    Alcohol/week: 1.0 standard drink of alcohol    Types: 1 Cans of beer per week    Comment: occassionally   Drug use: No    Pertinent Clinical Results:  LABS: Labs reviewed: Acceptable for surgery.  Component Date Value Ref Range Status   Glucose 03/20/2022 94  70 - 99 mg/dL Final   BUN 03/20/2022 15  8 - 27 mg/dL Final   Creatinine, Ser 03/20/2022 1.14  0.76 - 1.27 mg/dL Final   eGFR 03/20/2022 73  >59 mL/min/1.73 Final   BUN/Creatinine Ratio 03/20/2022 13  10 - 24 Final   Sodium 03/20/2022 141  134 - 144 mmol/L Final   Potassium 03/20/2022 4.5  3.5 - 5.2 mmol/L Final   Chloride 03/20/2022 101  96 - 106 mmol/L Final   CO2 03/20/2022 26  20 - 29 mmol/L Final   Calcium 03/20/2022 9.7  8.6 - 10.2 mg/dL Final   Total Protein 03/20/2022 6.9  6.0 - 8.5 g/dL Final   Albumin 03/20/2022 4.4  3.9 - 4.9 g/dL Final                 **Please note reference interval change**   Globulin, Total 03/20/2022 2.5  1.5 - 4.5 g/dL Final   Albumin/Globulin Ratio 03/20/2022 1.8  1.2 - 2.2 Final   Bilirubin Total 03/20/2022 0.3  0.0 - 1.2 mg/dL Final   Alkaline Phosphatase 03/20/2022 94  44 - 121  IU/L Final   AST 03/20/2022 22  0 - 40 IU/L Final   ALT 03/20/2022 15  0 - 44 IU/L Final   Cholesterol, Total 03/20/2022 139  100 - 199 mg/dL Final   Triglycerides 03/20/2022 166 (H)  0 - 149 mg/dL Final   HDL 03/20/2022 47  >39 mg/dL Final   VLDL Cholesterol Cal 03/20/2022 28  5 - 40 mg/dL Final   LDL Chol Calc (NIH) 03/20/2022 64  0 - 99 mg/dL Final   Chol/HDL Ratio 03/20/2022 3.0  0.0 - 5.0 ratio Final   Hgb A1c MFr Bld 03/20/2022 5.9 (H)  4.8 - 5.6 % Final   Comment:           Prediabetes: 5.7 - 6.4 Diabetes: >6.4 Glycemic control for adults with  diabetes: <7.0   Est. average glucose Bld gHb Est-m* 03/20/2022 123  mg/dL Final    ECG: Date: 07/30/2022 Time ECG obtained: 0837 AM Rate: 82 bpm Rhythm:  Normal sinus rhythm; RBBB Axis (leads I and aVF): Normal Intervals: PR 190 ms. QRS 132 ms. QTc 460 ms. ST segment and T wave changes: No evidence of acute ST segment elevation or depression Comparison: Similar to previous tracing obtained on 01/11/2020   IMAGING / PROCEDURES: LEFT HEART CATHETERIZATION AND CORONARY ANGIOGRAPHY performed on 08/07/2014 Normal left ventricular systolic function with EF of 55% LVEDP = 9 mmHg Mild anterior wall hypokinesis Single-vessel CAD 95% proximal LAD Successful PCI 3.0 x 15 mm integrity Resolute DES x 1 to the proximal LAD Recommendations ASA 81 mg indefinitely Prasugrel 10 mg daily for at least 12 months; ideally longer CYP2C19 genotyping  PET MYOCARDIAL PERFUSION MULTIPLE performed on 70/78/6754 Global systolic function normal with a calculated EF of 63% Anterior and septal wall hypokinesis No significant coronary calcifications noted on attenuation correction CT Mild prominence of the right ventricle Large in size, severe, nearly complete reversible defect involving the apical septal, apical anterior, apical, mid anteroseptal, mid anterior, and basal anteroseptal segments consistent with ischemia. Abnormal study  TRANSTHORACIC  ECHOCARDIOGRAM performed on 07/25/2014 Normal left ventricular systolic function with an EF of 60-65% Normal left ventricular diastolic Doppler parameters Normal right ventricular systolic function Trivial tricuspid valve regurgitation Degenerative mitral valve disease Normal transvalvular gradients; no valvular stenosis No pericardial effusion  Impression and Plan:  Richard Chad. has been referred for pre-anesthesia review and clearance prior to him undergoing the planned anesthetic and procedural courses. Available labs, pertinent testing, and imaging results were personally reviewed by me. This patient has been appropriately cleared by cardiology with an overall LOW to MODERATE risk of significant perioperative cardiovascular complications.  Based on clinical review performed today (08/05/22), barring any significant acute changes in the patient's overall condition, it is anticipated that he will be able to proceed with the planned surgical intervention. Any acute changes in clinical condition may necessitate his procedure being postponed and/or cancelled. Patient will meet with anesthesia team (MD and/or CRNA) on the day of his procedure for preoperative evaluation/assessment. Questions regarding anesthetic course will be fielded at that time.   Pre-surgical instructions were reviewed with the patient during his PAT appointment and questions were fielded by PAT clinical staff. Patient was advised that if any questions or concerns arise prior to his procedure then he should return a call to PAT and/or his surgeon's office to discuss.  Honor Loh, MSN, APRN, FNP-C, CEN [facility]  Peri-operative Services Nurse Practitioner Phone: [phone] Fax: [phone] 08/05/22 9:19 AM  NOTE: This note has been prepared using Dragon dictation software. Despite my best ability to proofread, there is always the potential that unintentional transcriptional errors may  still occur from this process.

## 2022-08-04 NOTE — Patient Instructions (Signed)
Your procedure is scheduled on: 08/07/22 Report to [place]. To find out your arrival time please call [phone] between 1PM - 3PM on 08/06/22.  Remember: Instructions that are not followed completely may result in serious medical risk, up to and including death, or upon the discretion of your surgeon and anesthesiologist your surgery may need to be rescheduled.     _X__ 1. Do not eat food or drink any liquids after midnight the night before your procedure.                 No gum chewing or hard candies.   __X__2.  On the morning of surgery brush your teeth with toothpaste and water, you                 may rinse your mouth with mouthwash if you wish.  Do not swallow any              toothpaste of mouthwash.     _X__ 3.  No Alcohol for 24 hours before or after surgery.   _X__ 4.  Do Not Smoke or use e-cigarettes For 24 Hours Prior to Your Surgery.                 Do not use any chewable tobacco products for at least 6 hours prior to                 surgery.  ____  5.  Bring all medications with you on the day of surgery if instructed.   __X__  6.  Notify your doctor if there is any change in your medical condition      (cold, fever, infections).     Do not wear jewelry, make-up, hairpins, clips or nail polish. Do not wear lotions, powders, or perfumes. No deodorant Do not shave body ahir 48 hours prior to surgery. Men may shave face and neck. Do not bring valuables to the hospital.    [facility] is not responsible for any belongings or valuables.  Contacts, dentures/partials or body piercings may not be worn into surgery. Bring a case for your contacts, glasses or hearing aids, a denture cup will be supplied. Leave your suitcase in the car. After surgery it may be brought to your room. For patients admitted to the hospital, discharge time is determined by your treatment team.   Patients discharged the day of surgery will  not be allowed to drive home.    __X__ Take these medicines the morning of surgery with A SIP OF WATER:    1. loratadine (CLARITIN) 10 MG tablet   2. metoprolol succinate (TOPROL-XL) 50 MG 24 hr tablet   3. rosuvastatin (CRESTOR) 40 MG tablet   4.  5.  6.  ____ Fleet Enema (as directed)   ____ Use CHG Soap/SAGE wipes as directed  ____ Use inhalers on the day of surgery  ____ Stop metformin/Janumet/Farxiga 2 days prior to surgery    ____ Take 1/2 of usual insulin dose the night before surgery. No insulin the morning          of surgery.   __X__ Stop Blood Thinners (Effient being held since Thurs 07/31/22)  __X__ Stop Anti-inflammatories 7 days before surgery such as Advil, Ibuprofen, Motrin,  BC or Goodies Powder, Naprosyn, Naproxen, Aleve   __X__ Stop all herbals and supplements, fish oil or vitamins  until after surgery.    ____ Bring C-Pap to  the hospital.

## 2022-08-05 ENCOUNTER — Encounter: Payer: Self-pay | Admitting: Orthopedic Surgery

## 2022-08-06 MED ORDER — ORAL CARE MOUTH RINSE: 15.0000 mL | Freq: Once | OROMUCOSAL | Status: AC

## 2022-08-06 MED ORDER — FAMOTIDINE 20 MG PO TABS: 20.0000 mg | ORAL_TABLET | Freq: Once | ORAL | Status: AC

## 2022-08-06 MED ORDER — ACETAMINOPHEN 500 MG PO TABS: 1000.0000 mg | ORAL_TABLET | ORAL | Status: AC

## 2022-08-06 MED ORDER — CHLORHEXIDINE GLUCONATE CLOTH 2 % EX PADS: 6.0000 | MEDICATED_PAD | Freq: Once | CUTANEOUS | Status: DC

## 2022-08-06 MED ORDER — CEFAZOLIN SODIUM-DEXTROSE 2-4 GM/100ML-% IV SOLN
2.0000 g | INTRAVENOUS | Status: AC
  Administered 2022-08-07: 2 g via INTRAVENOUS

## 2022-08-06 MED ORDER — CHLORHEXIDINE GLUCONATE 0.12 % MT SOLN: 15.0000 mL | Freq: Once | OROMUCOSAL | Status: AC

## 2022-08-06 MED ORDER — CHLORHEXIDINE GLUCONATE CLOTH 2 % EX PADS
6.0000 | MEDICATED_PAD | Freq: Once | CUTANEOUS | Status: AC
  Administered 2022-08-07: 6 via TOPICAL

## 2022-08-06 MED ORDER — LACTATED RINGERS IV SOLN: INTRAVENOUS | Status: DC

## 2022-08-07 ENCOUNTER — Other Ambulatory Visit: Payer: Self-pay

## 2022-08-07 ENCOUNTER — Ambulatory Visit: Payer: 59 | Admitting: Urgent Care

## 2022-08-07 ENCOUNTER — Ambulatory Visit
Admission: RE | Disposition: A | Discharge status: Home / Self Care | Payer: Self-pay | Source: Home / Self Care | Attending: Orthopedic Surgery

## 2022-08-07 ENCOUNTER — Encounter: Payer: Self-pay | Admitting: Orthopedic Surgery

## 2022-08-07 ENCOUNTER — Ambulatory Visit
Admission: RE | Admit: 2022-08-07 | Discharge: 2022-08-07 | Disposition: A | Discharge status: Home / Self Care | Payer: 59 | Attending: Orthopedic Surgery | Admitting: Orthopedic Surgery

## 2022-08-07 DIAGNOSIS — R7303 Prediabetes: Secondary | ICD-10-CM | POA: Diagnosis not present

## 2022-08-07 DIAGNOSIS — Z955 Presence of coronary angioplasty implant and graft: Secondary | ICD-10-CM | POA: Diagnosis not present

## 2022-08-07 DIAGNOSIS — I259 Chronic ischemic heart disease, unspecified: Secondary | ICD-10-CM | POA: Insufficient documentation

## 2022-08-07 DIAGNOSIS — E785 Hyperlipidemia, unspecified: Secondary | ICD-10-CM | POA: Diagnosis not present

## 2022-08-07 DIAGNOSIS — I4510 Unspecified right bundle-branch block: Secondary | ICD-10-CM | POA: Insufficient documentation

## 2022-08-07 DIAGNOSIS — X58XXXA Exposure to other specified factors, initial encounter: Secondary | ICD-10-CM | POA: Insufficient documentation

## 2022-08-07 DIAGNOSIS — S43431A Superior glenoid labrum lesion of right shoulder, initial encounter: Secondary | ICD-10-CM | POA: Insufficient documentation

## 2022-08-07 DIAGNOSIS — M75121 Complete rotator cuff tear or rupture of right shoulder, not specified as traumatic: Secondary | ICD-10-CM | POA: Diagnosis not present

## 2022-08-07 DIAGNOSIS — I252 Old myocardial infarction: Secondary | ICD-10-CM | POA: Insufficient documentation

## 2022-08-07 DIAGNOSIS — I10 Essential (primary) hypertension: Secondary | ICD-10-CM | POA: Insufficient documentation

## 2022-08-07 DIAGNOSIS — M19011 Primary osteoarthritis, right shoulder: Secondary | ICD-10-CM | POA: Diagnosis not present

## 2022-08-07 DIAGNOSIS — I2510 Atherosclerotic heart disease of native coronary artery without angina pectoris: Secondary | ICD-10-CM | POA: Diagnosis not present

## 2022-08-07 HISTORY — DX: Other specific arthropathies, not elsewhere classified, left shoulder: M12.812

## 2022-08-07 HISTORY — DX: Acute coronary thrombosis not resulting in myocardial infarction: I25.9

## 2022-08-07 HISTORY — PX: SHOULDER ARTHROSCOPY WITH OPEN ROTATOR CUFF REPAIR AND DISTAL CLAVICLE ACROMINECTOMY: SHX5683

## 2022-08-07 HISTORY — DX: Long term (current) use of antithrombotics/antiplatelets: Z79.02

## 2022-08-07 HISTORY — DX: Unspecified right bundle-branch block: I45.10

## 2022-08-07 HISTORY — DX: Acute coronary thrombosis not resulting in myocardial infarction: I24.0

## 2022-08-07 HISTORY — DX: Spermatocele of epididymis, unspecified: N43.40

## 2022-08-07 SURGERY — SHOULDER ARTHROSCOPY WITH OPEN ROTATOR CUFF REPAIR AND DISTAL CLAVICLE ACROMINECTOMY
Anesthesia: General | Laterality: Left

## 2022-08-07 MED ORDER — SODIUM CHLORIDE 0.9 % IR SOLN: Status: DC | PRN

## 2022-08-07 MED ORDER — OXYCODONE HCL 5 MG/5ML PO SOLN: 5.0000 mg | Freq: Once | ORAL | Status: AC | PRN

## 2022-08-07 MED ORDER — FENTANYL CITRATE (PF) 100 MCG/2ML IJ SOLN
INTRAMUSCULAR | Status: AC
  Filled 2022-08-07: qty 2

## 2022-08-07 MED ORDER — LIDOCAINE HCL (PF) 1 % IJ SOLN
INTRAMUSCULAR | Status: DC | PRN
  Administered 2022-08-07: 12 mL

## 2022-08-07 MED ORDER — FENTANYL CITRATE (PF) 100 MCG/2ML IJ SOLN
INTRAMUSCULAR | Status: DC | PRN
  Administered 2022-08-07: 25 ug via INTRAVENOUS
  Administered 2022-08-07: 50 ug via INTRAVENOUS
  Administered 2022-08-07: 100 ug via INTRAVENOUS
  Administered 2022-08-07: 25 ug via INTRAVENOUS

## 2022-08-07 MED ORDER — FENTANYL CITRATE (PF) 100 MCG/2ML IJ SOLN
INTRAMUSCULAR | Status: AC
  Administered 2022-08-07: 25 ug via INTRAVENOUS
  Filled 2022-08-07: qty 2

## 2022-08-07 MED ORDER — MIDAZOLAM HCL 2 MG/2ML IJ SOLN
INTRAMUSCULAR | Status: AC
  Filled 2022-08-07: qty 2

## 2022-08-07 MED ORDER — MIDAZOLAM HCL 2 MG/2ML IJ SOLN
INTRAMUSCULAR | Status: DC | PRN
  Administered 2022-08-07: 2 mg via INTRAVENOUS

## 2022-08-07 MED ORDER — OXYCODONE HCL 5 MG PO TABS
5.0000 mg | ORAL_TABLET | Freq: Once | ORAL | Status: AC | PRN
  Administered 2022-08-07: 5 mg via ORAL

## 2022-08-07 MED ORDER — ROCURONIUM BROMIDE 10 MG/ML (PF) SYRINGE
PREFILLED_SYRINGE | INTRAVENOUS | Status: AC
  Filled 2022-08-07: qty 10

## 2022-08-07 MED ORDER — PHENYLEPHRINE 80 MCG/ML (10ML) SYRINGE FOR IV PUSH (FOR BLOOD PRESSURE SUPPORT)
PREFILLED_SYRINGE | INTRAVENOUS | Status: DC | PRN
  Administered 2022-08-07: 80 ug via INTRAVENOUS
  Administered 2022-08-07: 160 ug via INTRAVENOUS
  Administered 2022-08-07: 80 ug via INTRAVENOUS
  Administered 2022-08-07: 160 ug via INTRAVENOUS

## 2022-08-07 MED ORDER — EPHEDRINE SULFATE (PRESSORS) 50 MG/ML IJ SOLN
INTRAMUSCULAR | Status: DC | PRN
  Administered 2022-08-07: 5 mg via INTRAVENOUS

## 2022-08-07 MED ORDER — EPHEDRINE 5 MG/ML INJ
INTRAVENOUS | Status: AC
  Filled 2022-08-07: qty 5

## 2022-08-07 MED ORDER — VASOPRESSIN 20 UNIT/ML IV SOLN
INTRAVENOUS | Status: DC | PRN
  Administered 2022-08-07: 2 [IU] via INTRAVENOUS

## 2022-08-07 MED ORDER — OXYCODONE HCL 5 MG PO TABS
ORAL_TABLET | ORAL | Status: AC
  Filled 2022-08-07: qty 1

## 2022-08-07 MED ORDER — CHLORHEXIDINE GLUCONATE 0.12 % MT SOLN
OROMUCOSAL | Status: AC
  Administered 2022-08-07: 15 mL via OROMUCOSAL
  Filled 2022-08-07: qty 15

## 2022-08-07 MED ORDER — ONDANSETRON HCL 4 MG/2ML IJ SOLN
INTRAMUSCULAR | Status: DC | PRN
  Administered 2022-08-07: 4 mg via INTRAVENOUS

## 2022-08-07 MED ORDER — BUPIVACAINE LIPOSOME 1.3 % IJ SUSP
INTRAMUSCULAR | Status: AC
  Filled 2022-08-07: qty 20

## 2022-08-07 MED ORDER — LIDOCAINE HCL (CARDIAC) PF 100 MG/5ML IV SOSY
PREFILLED_SYRINGE | INTRAVENOUS | Status: DC | PRN
  Administered 2022-08-07: 100 mg via INTRAVENOUS

## 2022-08-07 MED ORDER — LIDOCAINE HCL (PF) 1 % IJ SOLN
INTRAMUSCULAR | Status: AC
  Filled 2022-08-07: qty 30

## 2022-08-07 MED ORDER — FENTANYL CITRATE (PF) 100 MCG/2ML IJ SOLN
25.0000 ug | INTRAMUSCULAR | Status: DC | PRN
  Administered 2022-08-07 (×2): 25 ug via INTRAVENOUS

## 2022-08-07 MED ORDER — RINGERS IRRIGATION IR SOLN
Status: DC | PRN
  Administered 2022-08-07: 6000 mL
  Administered 2022-08-07: 24000

## 2022-08-07 MED ORDER — PHENYLEPHRINE 80 MCG/ML (10ML) SYRINGE FOR IV PUSH (FOR BLOOD PRESSURE SUPPORT)
PREFILLED_SYRINGE | INTRAVENOUS | Status: AC
  Filled 2022-08-07: qty 10

## 2022-08-07 MED ORDER — ACETAMINOPHEN 500 MG PO TABS
ORAL_TABLET | ORAL | Status: AC
  Administered 2022-08-07: 1000 mg via ORAL
  Filled 2022-08-07: qty 2

## 2022-08-07 MED ORDER — SUGAMMADEX SODIUM 500 MG/5ML IV SOLN
INTRAVENOUS | Status: DC | PRN
  Administered 2022-08-07: 500 mg via INTRAVENOUS

## 2022-08-07 MED ORDER — ROCURONIUM BROMIDE 100 MG/10ML IV SOLN
INTRAVENOUS | Status: DC | PRN
  Administered 2022-08-07: 100 mg via INTRAVENOUS
  Administered 2022-08-07 (×2): 20 mg via INTRAVENOUS
  Administered 2022-08-07: 10 mg via INTRAVENOUS

## 2022-08-07 MED ORDER — ONDANSETRON HCL 4 MG PO TABS: 4.0000 mg | ORAL_TABLET | Freq: Three times a day (TID) | ORAL | 0 refills | Status: AC | PRN

## 2022-08-07 MED ORDER — SUGAMMADEX SODIUM 500 MG/5ML IV SOLN
INTRAVENOUS | Status: AC
  Filled 2022-08-07: qty 5

## 2022-08-07 MED ORDER — DEXAMETHASONE SODIUM PHOSPHATE 10 MG/ML IJ SOLN
INTRAMUSCULAR | Status: DC | PRN
  Administered 2022-08-07: 10 mg via INTRAVENOUS

## 2022-08-07 MED ORDER — BUPIVACAINE HCL (PF) 0.25 % IJ SOLN
INTRAMUSCULAR | Status: AC
  Filled 2022-08-07: qty 30

## 2022-08-07 MED ORDER — PROPOFOL 10 MG/ML IV BOLUS
INTRAVENOUS | Status: DC | PRN
  Administered 2022-08-07: 150 mg via INTRAVENOUS

## 2022-08-07 MED ORDER — NEOMYCIN-POLYMYXIN B GU 40-200000 IR SOLN
Status: AC
  Filled 2022-08-07: qty 20

## 2022-08-07 MED ORDER — OXYCODONE HCL 5 MG PO TABS: 5.0000 mg | ORAL_TABLET | ORAL | 0 refills | Status: DC | PRN

## 2022-08-07 MED ORDER — EPINEPHRINE PF 1 MG/ML IJ SOLN
INTRAMUSCULAR | Status: DC | PRN
  Administered 2022-08-07: 4 mL via SUBCUTANEOUS

## 2022-08-07 MED ORDER — EPINEPHRINE PF 1 MG/ML IJ SOLN
INTRAMUSCULAR | Status: AC
  Filled 2022-08-07: qty 4

## 2022-08-07 MED ORDER — CEFAZOLIN SODIUM-DEXTROSE 2-4 GM/100ML-% IV SOLN
INTRAVENOUS | Status: AC
  Filled 2022-08-07: qty 100

## 2022-08-07 MED ORDER — PHENYLEPHRINE HCL-NACL 20-0.9 MG/250ML-% IV SOLN
INTRAVENOUS | Status: DC | PRN
  Administered 2022-08-07: 50 ug/min via INTRAVENOUS

## 2022-08-07 MED ORDER — FAMOTIDINE 20 MG PO TABS
ORAL_TABLET | ORAL | Status: AC
  Administered 2022-08-07: 20 mg via ORAL
  Filled 2022-08-07: qty 1

## 2022-08-07 MED ORDER — VASOPRESSIN 20 UNIT/ML IV SOLN
INTRAVENOUS | Status: AC
  Filled 2022-08-07: qty 1

## 2022-08-07 MED ORDER — PROPOFOL 10 MG/ML IV BOLUS
INTRAVENOUS | Status: AC
  Filled 2022-08-07: qty 40

## 2022-08-07 SURGICAL SUPPLY — 66 items
ADAPTER IRRIG TUBE 2 SPIKE SOL (ADAPTER) ×2 IMPLANT
CANNULA 5.75X7 CRYSTAL CLEAR (CANNULA) ×2 IMPLANT
CANNULA PARTIAL THREAD 2X7 (CANNULA) ×1 IMPLANT
CANNULA TWIST IN 8.25X9CM (CANNULA) ×2 IMPLANT
CONNECTOR PERFECT PASSER (CONNECTOR) ×2 IMPLANT
COOLER POLAR GLACIER W/PUMP (MISCELLANEOUS) ×1 IMPLANT
DEVICE SUCT BLK HOLE OR FLOOR (MISCELLANEOUS) ×2 IMPLANT
DRAPE 3/4 80X56 (DRAPES) ×1 IMPLANT
DRAPE INCISE IOBAN 66X45 STRL (DRAPES) ×1 IMPLANT
DRAPE U-SHAPE 47X51 STRL (DRAPES) ×1 IMPLANT
DURAPREP 26ML APPLICATOR (WOUND CARE) ×3 IMPLANT
ELECT REM PT RETURN 9FT ADLT (ELECTROSURGICAL) ×1
ELECTRODE REM PT RTRN 9FT ADLT (ELECTROSURGICAL) ×1 IMPLANT
GAUZE SPONGE 4X4 12PLY STRL (GAUZE/BANDAGES/DRESSINGS) ×1 IMPLANT
GAUZE XEROFORM 1X8 LF (GAUZE/BANDAGES/DRESSINGS) ×1 IMPLANT
GLOVE BIOGEL PI IND STRL 9 (GLOVE) ×1 IMPLANT
GLOVE BIOGEL PI ORTHO SZ9 (GLOVE) ×6 IMPLANT
GOWN STRL REUS TWL 2XL XL LVL4 (GOWN DISPOSABLE) ×1 IMPLANT
GOWN STRL REUS W/ TWL LRG LVL3 (GOWN DISPOSABLE) ×1 IMPLANT
GOWN STRL REUS W/TWL LRG LVL3 (GOWN DISPOSABLE) ×1
IV LACTATED RINGER IRRG 3000ML (IV SOLUTION) ×8
IV LR IRRIG 3000ML ARTHROMATIC (IV SOLUTION) ×8 IMPLANT
KIT STABILIZATION SHOULDER (MISCELLANEOUS) ×1 IMPLANT
KIT SUTURE 2.8 Q-FIX DISP (MISCELLANEOUS) ×1 IMPLANT
KIT SUTURETAK 3.0 INSERT PERC (KITS) IMPLANT
KIT TURNOVER KIT A (KITS) ×1 IMPLANT
MANIFOLD NEPTUNE II (INSTRUMENTS) ×2 IMPLANT
MASK FACE SPIDER DISP (MASK) ×1 IMPLANT
MAT ABSORB  FLUID 56X50 GRAY (MISCELLANEOUS) ×2
MAT ABSORB FLUID 56X50 GRAY (MISCELLANEOUS) ×3 IMPLANT
NDL SAFETY ECLIP 18X1.5 (MISCELLANEOUS) ×1 IMPLANT
NEEDLE HYPO 22GX1.5 SAFETY (NEEDLE) ×1 IMPLANT
NS IRRIG 500ML POUR BTL (IV SOLUTION) ×1 IMPLANT
PACK ARTHROSCOPY SHOULDER (MISCELLANEOUS) ×1 IMPLANT
PAD ABD DERMACEA PRESS 5X9 (GAUZE/BANDAGES/DRESSINGS) ×1 IMPLANT
PAD ARMBOARD 7.5X6 YLW CONV (MISCELLANEOUS) ×2 IMPLANT
PAD WRAPON POLAR SHDR XLG (MISCELLANEOUS) ×1 IMPLANT
PASSER SUT FIRSTPASS SELF (INSTRUMENTS) ×1 IMPLANT
SHAVER BLADE BONE CUTTER 4.5 (BLADE) ×1 IMPLANT
SHAVER BLADE TAPERED BLUNT 4 (BLADE) ×1 IMPLANT
SLEEVE REMOTE CONTROL 5X12 (DRAPES) IMPLANT
SLING ARM LRG DEEP (SOFTGOODS) IMPLANT
SPONGE T-LAP 18X18 ~~LOC~~+RFID (SPONGE) ×1 IMPLANT
STRIP CLOSURE SKIN 1/2X4 (GAUZE/BANDAGES/DRESSINGS) ×1 IMPLANT
SUT ETHILON 4-0 (SUTURE) ×1
SUT ETHILON 4-0 FS2 18XMFL BLK (SUTURE) ×1
SUT LASSO 90 DEG SD STR (SUTURE) IMPLANT
SUT MNCRL 4-0 (SUTURE) ×1
SUT MNCRL 4-0 27XMFL (SUTURE) ×1
SUT PDS AB 0 CT1 27 (SUTURE) ×3 IMPLANT
SUT PERFECTPASSER WHITE CART (SUTURE) ×4 IMPLANT
SUT SMART STITCH CARTRIDGE (SUTURE) ×4 IMPLANT
SUT ULTRABRAID 2 COBRAID 38 (SUTURE) IMPLANT
SUT VIC AB 0 CT1 36 (SUTURE) ×3 IMPLANT
SUT VIC AB 2-0 CT2 27 (SUTURE) ×1 IMPLANT
SUTURE ETHLN 4-0 FS2 18XMF BLK (SUTURE) ×1 IMPLANT
SUTURE MNCRL 4-0 27XMF (SUTURE) ×1 IMPLANT
SYR 10ML LL (SYRINGE) ×1 IMPLANT
TAPE MICROFOAM 4IN (TAPE) ×1 IMPLANT
TRAP FLUID SMOKE EVACUATOR (MISCELLANEOUS) ×1 IMPLANT
TUBING CONNECTING 10 (TUBING) ×1 IMPLANT
TUBING INFLOW SET DBFLO PUMP (TUBING) ×1 IMPLANT
TUBING OUTFLOW SET DBLFO PUMP (TUBING) ×1 IMPLANT
WAND WEREWOLF FLOW 90D (MISCELLANEOUS) ×1 IMPLANT
WATER STERILE IRR 500ML POUR (IV SOLUTION) ×1 IMPLANT
WRAPON POLAR PAD SHDR XLG (MISCELLANEOUS)

## 2022-08-07 NOTE — H&P (Signed)
PREOPERATIVE H&P  Chief Complaint: Right Shoulder Rotator Cuff Tear  HPI: Richard Underwood. is a 61 y.o. male who presents for preoperative history and physical with a diagnosis of Right Shoulder full-thickness retracted rotator Cuff Tear confirmed by MRI. Symptoms of right shoulder pain, weakness and limited range of motion are significantly impairing activities of daily living.  Patient has failed nonoperative management wished to proceed with surgical fixation.  Past Medical History:  Diagnosis Date   Coronary artery disease 07/2014   a.) LHC/PCI 07/26/2014: 95% pLAD (3.0 x 15 mm Integrity Resolute DES)   Family history of adverse reaction to anesthesia     brother went into coma (hx aids weak immune system chronic pneumonia)   History of kidney stones    Hypercholesteremia    Hypertension    Hypothyroidism 07/25/2014   a.) s/p partial thyroidectomy   Ischemic heart disease due to coronary artery obstruction ([place])    Long term current use of antithrombotics/antiplatelets    a.) on DAPT (ASA + prasugrel)   NSTEMI (non-ST elevated myocardial infarction) ([place]) 07/25/2014   a.) LHC/PCI 07/26/2014: 95% pLAD (3.0 x 15 mm Integrity Resolute DES)   Palpitations    Pre-diabetes    RBBB (right bundle branch block)    Rotator cuff arthropathy, left    Spermatocele    a.) s/p LEFT spermatocelectomy 01/06/2020   Thyroid mass 2008   a.) s/p partial thyroidectomy; pathology benign   Past Surgical History:  Procedure Laterality Date   ACHILLES TENDON SURGERY Right 08/17/2020   Procedure: ACHILLES TENDON REPAIR RIGHT;  Surgeon: Samara Deist, DPM;  Location: [facility];  Service: Podiatry;  Laterality: Right;   COLONOSCOPY  08/2016   Physcian requested to do again in 3 years. Polyps were found.   CORONARY ANGIOPLASTY WITH STENT PLACEMENT  07/26/2014   Procedure: CORONARY ANGIOPLASTY WITH STENT PLACEMENT; Location: [facility]; Surgeon: Jacques Earthly, MD   OSTECTOMY Right 08/17/2020    Procedure: OSTECTOMY-HAGLUNDS/RETROCALCANEAL;  Surgeon: Samara Deist, DPM;  Location: [facility];  Service: Podiatry;  Laterality: Right;   SPERMATOCELECTOMY Left 01/06/2020   Procedure: SPERMATOCELECTOMY;  Surgeon: Billey Co, MD;  Location: [facility];  Service: Urology;  Laterality: Left;   THYROIDECTOMY, PARTIAL  2008   Social History   Socioeconomic History   Marital status: Divorced    Spouse name: Not on file   Number of children: Not on file   Years of education: Not on file   Highest education level: Not on file  Occupational History   Not on file  Tobacco Use   Smoking status: Never   Smokeless tobacco: Never  Vaping Use   Vaping Use: Never used  Substance and Sexual Activity   Alcohol use: Yes    Alcohol/week: 1.0 standard drink of alcohol    Types: 1 Cans of beer per week    Comment: occassionally   Drug use: No   Sexual activity: Yes    Birth control/protection: None  Other Topics Concern   Not on file  Social History Narrative   Not on file   Social Determinants of Health   Financial Resource Strain: Not on file  Food Insecurity: Not on file  Transportation Needs: Not on file  Physical Activity: Not on file  Stress: Not on file  Social Connections: Not on file   Family History  Problem Relation Age of Onset   Diabetes Mother    Hypertension Mother    Diabetes Maternal Grandmother    Diabetes Maternal Grandfather  Allergies  Allergen Reactions   Septra [Sulfamethoxazole-Trimethoprim]     Blistered on penis   Tetracyclines & Related Rash    Blister of skin   Prior to Admission medications   Medication Sig Start Date End Date Taking? Authorizing Provider  aspirin EC 81 MG tablet Take 81 mg by mouth daily.    Yes [provider]  loratadine (CLARITIN) 10 MG tablet Take 10 mg by mouth daily.   Yes [provider]  metoprolol succinate (TOPROL-XL) 50 MG 24 hr tablet Take 50 mg by mouth daily. Take with or immediately  following a meal.   Yes [provider]  Misc Natural Products (JOINT HEALTH PO) Take 2 tablets by mouth daily.   Yes [provider]  Multiple Vitamin (ONE DAILY) tablet Take 1 tablet by mouth daily.    Yes [provider]  rosuvastatin (CRESTOR) 40 MG tablet TAKE 1 TABLET DAILY 07/29/22  Yes Glean Hess, MD  telmisartan-hydrochlorothiazide (MICARDIS HCT) 80-25 MG tablet TAKE 1 TABLET DAILY 06/16/22  Yes Glean Hess, MD  prasugrel (EFFIENT) 10 MG TABS tablet Take 10 mg by mouth daily.    [provider]     Positive ROS: All other systems have been reviewed and were otherwise negative with the exception of those mentioned in the HPI and as above.  Physical Exam: General: Alert, no acute distress Cardiovascular: Regular rate and rhythm, no murmurs rubs or gallops.  No pedal edema Respiratory: Clear to auscultation bilaterally, no wheezes rales or rhonchi. No cyanosis, no use of accessory musculature GI: No organomegaly, abdomen is soft and non-tender nondistended with positive bowel sounds. Skin: Skin intact, no lesions within the operative field. Neurologic: Sensation intact distally Psychiatric: Patient is competent for consent with normal mood and affect Lymphatic: No cervical lymphadenopathy  MUSCULOSKELETAL: Right shoulder: The patient can forward elevate and abduct to approximately 70 degrees. He has pain and weakness to a downward directed for [facility] on his abducted shoulder. The patient did not have significant weakness of shoulder internal or external rotation. He has a full digital wrist and elbow range of motion, intact sensation to light touch and a palpable radial pulse. He had pain with impingement testing and mild tenderness over the [facility] joint.    Radiology:  I reviewed the MRI of the right shoulder including the images and radiology report. The patient has a large rotator cuff tear with retraction of the supraspinatus and  infraspinatus insertions. He has moderate bursitis and mild muscle fatty atrophy. He has fraying of the subscapularis insertion with a medially subluxed long head of the biceps tendon which is partially torn. He has axillary capsular swelling compatible with adhesive capsulitis.   Assessment: Right Shoulder Rotator Cuff Tear  Plan: Plan for Procedure(s): RIGHT SHOULDER ARTHROSCOPIC SUBACROMIAL DECOMPRESSION, DISTAL CLAVICLE EXCISION WITH OPEN ROTATOR CUFF REPAIR AND BICEPS TENODESIS VS. TENOTOMY  I have reviewed the details of the operation as well as the postoperative course with the patient.  A preop history and physical was performed at the bedside this morning.  I marked the right shoulder according to hospital's quickset of surgery protocol.  I answered all his questions.  I discussed the risks and benefits of surgery. The risks include but are not limited to infection, bleeding , nerve or blood vessel injury, joint stiffness or loss of motion, persistent pain, weakness or instability, retear of the rotator cuff, Popeye deformity of the biceps, hardware failure and the need for further surgery. Medical risks include  but are not limited to DVT and pulmonary embolism, myocardial infarction, stroke, pneumonia, respiratory failure and death. Patient understood these risks and wished to proceed.     Thornton Park, MD   08/07/2022 12:02 PM

## 2022-08-07 NOTE — Discharge Instructions (Addendum)
AMBULATORY SURGERY  DISCHARGE INSTRUCTIONS   The drugs that you were given will stay in your system until tomorrow so for the next 24 hours you should not:  Drive an automobile Make any legal decisions Drink any alcoholic beverage   You may resume regular meals tomorrow.  Today it is better to start with liquids and gradually work up to solid foods.  You may eat anything you prefer, but it is better to start with liquids, then soup and crackers, and gradually work up to solid foods.   Please notify your doctor immediately if you have any unusual bleeding, trouble breathing, redness and pain at the surgery site, drainage, fever, or pain not relieved by medication.    Additional Instructions: [place] for patient to move and use arm , just keep sling for comfort,,may take off when he's ready  Ok to start  blood thinners tommorow     Please contact your physician with any problems or Same Day Surgery at [phone], Monday through Friday 6 am to 4 pm, or [place] at [facility] number at [phone].

## 2022-08-07 NOTE — Anesthesia Procedure Notes (Signed)
Procedure Name: Intubation Date/Time: 08/07/2022 12:09 PM  Performed by: Lily Peer, Sharah Finnell, CRNAPre-anesthesia Checklist: Patient identified, Emergency Drugs available, Suction available and Patient being monitored Patient Re-evaluated:Patient Re-evaluated prior to induction Oxygen Delivery Method: Circle system utilized Preoxygenation: Pre-oxygenation with 100% oxygen Induction Type: IV induction Laryngoscope Size: McGraph and 4 Grade View: Grade I Tube type: Oral Number of attempts: 1 Airway Equipment and Method: Stylet Placement Confirmation: ETT inserted through vocal cords under direct vision, positive ETCO2 and breath sounds checked- equal and bilateral Secured at: 22 cm Tube secured with: Tape Dental Injury: Teeth and Oropharynx as per pre-operative assessment

## 2022-08-07 NOTE — Op Note (Signed)
08/07/2022  6:25 PM  PATIENT:  Richard Underwood.  61 y.o. male  PRE-OPERATIVE DIAGNOSIS: Full-thickness and retracted tear of the rotator cuff, right shoulder  POST-OPERATIVE DIAGNOSIS: Right shoulder irrepairable massive rotator cuff tear with complete tears of the supraspinatus infraspinatus and superior half of the subscapularis.  Type II superior labral tear with intact biceps tendon.  Subacromial spurring and advanced acromioclavicular joint arthrosis  PROCEDURE: Right shoulder arthroscopic labral and rotator cuff debridement, subacromial decompression and distal clavicle excision.  SURGEON:  Surgeon(s) and Role:    * Thornton Park, MD - Primary  ANESTHESIA:   local and general   PREOPERATIVE INDICATIONS:  [name]. is a  61 y.o. male with a diagnosis of a large retracted rotator cuff tear, confirmed by MRI who elected for surgical repair.      The risks benefits and alternatives were discussed with the patient preoperatively including but not limited to the risks of infection, bleeding, nerve injury, persistent pain or weakness, shoulder stiffness/arthrofibrosis, failure of the repair, re-tear of the rotator cuff and the need for further surgery. Medical risks include DVT and pulmonary embolism, myocardial infarction, stroke, pneumonia, respiratory failure and death. Patient understood these risks and wished to proceed.  OPERATIVE IMPLANTS: None  OPERATIVE PROCEDURE: The patient was met in the preoperative area. The right shoulder was signed with the word yes and my initials according the hospital's correct site of surgery protocol.   A pre-op history and physical was performed at the bedside.  Patient refused an interscalene block with Exparel.  Patient was brought to the operating room where he underwent general endotracheal intubation.  The patient was placed in a beachchair position.  A spider arm positioner was used for this case.  Examination under anesthesia  revealed no loss of passive range of motion or instability with load shift testing. The patient had a negative sulcus sign.  Patient was prepped and draped in a sterile fashion. A timeout was performed to verify the patient's name, date of birth, medical record number, correct site of surgery and correct procedure to be performed there was also used to verify the patient received antibiotics that all appropriate instruments, implants and radiographs studies were available in the room. Once all in attendance were in agreement case began.  Patient received ancef 2 grams IV for pre-op antibiotics.  Bony landmarks were drawn out with a surgical marker along with proposed arthroscopy incisions.  An 11 blade was used to establish a posterior portal through which the arthroscope was placed in the glenohumeral joint. A full diagnostic examination of the shoulder was performed.    The anterior and superior labrum were found to be frayed and debrided with a Dyonics tapered shaver blade.  Probing the superior labrum, the patient appeared to have a type II SLAP tear.  The biceps tendon itself was intact and in proper position.  There was no medial subluxation of the biceps seen during surgery.  The frayed edges of the superior and anterior labrum were debrided with a Dyonics tapered shaver blade.  The arthroscope was then placed via the posterior portal into the subacromial space.  A lateral portal was established under direct visualization using an 18-gauge spinal needle.  A subacromial decompression and debridement was performed to allow better visualization of the rotator cuff using a Dyonics 4.0 full radius shaver blade.  This same shaver was placed through the anterior portal and a distal clavicle excision was performed.  The patient had a massive  rotator cuff tear involving the entire supra and infraspinatus tendons with retraction to the glenohumeral joint.  Abundant scar tissue was seen throughout the superior  surface of the rotator cuff tendons.  The scar tissue was debrided using a Dyonics tapered shaver blade.  The superior half of the subscapularis was also torn and displaced superiorly.  Five [employer]hew Perfect Pass sutures were placed in the lateral border of the rotator cuff.  An arthroscopic elevator was used to mobilize the rotator cuff.  Despite debridement of scar tissue adhesions and mobilization with an elevator, the rotator cuff could not be reduced to the greater tuberosity footprint which had been debrided of torn rotator cuff fibers using the Dyonics bone cutter shaver blade.  I contacted my colleague, Dr. Roland Rack to look at the rotator cuff tear arthroscopically and discussed treatment options. I then broke scrub and spoke with the patient's daughter in the OR consultation room to discuss options with her including trying a xenograft patch vs. closing and having patient see one of our arthroplasty specialists to discuss a reverse total shoulder arthroplasty.  We agree the arthroplasty option may be a better long term solution for the patient.    I re-scrubbed and removed all the Perfect Pass sutures from the rotator cuff.  All incisions were copiously irrigated.  All arthroscopic instruments were then removed. Skin closure for the three arthroscopic incisions was performed with 4-0 nylon. A dry sterile dressing was applied.  The patient was placed in a sling.  All sharp, sponge and it instrument counts were correct at the conclusion of the case. I was scrubbed and present for the entire case. I spoke with the patient's daughter again postoperatively to let her that her father was stable in the recovery room reviewed the post-op instructions with her and answered all her questions.

## 2022-08-07 NOTE — Anesthesia Postprocedure Evaluation (Signed)
Anesthesia Post Note  Patient: Richard Underwood.  Procedure(s) Performed: SHOULDER ARTHROSCOPY WITH OPEN ROTATOR CUFF DEBRIDEMENT AND DISTAL CLAVICLE ACROMINECTOMY (Left)  Patient location during evaluation: PACU Anesthesia Type: General Level of consciousness: awake and alert Pain management: pain level controlled Vital Signs Assessment: post-procedure vital signs reviewed and stable Respiratory status: spontaneous breathing, nonlabored ventilation, respiratory function stable and patient connected to nasal cannula oxygen Cardiovascular status: blood pressure returned to baseline and stable Postop Assessment: no apparent nausea or vomiting Anesthetic complications: no   No notable events documented.   Last Vitals:  Vitals:   08/07/22 0940 08/07/22 1525  BP: (!) 182/94 (!) 148/74  Pulse: 73 81  Resp: 16   Temp: 36.8 C (!) 36.2 C  SpO2: 97% 94%    Last Pain:  Vitals:   08/07/22 1525  TempSrc:   PainSc: Asleep                 Ilene Qua

## 2022-08-07 NOTE — Anesthesia Preprocedure Evaluation (Signed)
Anesthesia Evaluation  Patient identified by MRN, date of birth, ID band Patient awake    Reviewed: Allergy & Precautions, NPO status , Patient's Chart, lab work & pertinent test results  History of Anesthesia Complications Negative for: history of anesthetic complications  Airway Mallampati: III  TM Distance: <3 FB Neck ROM: full    Dental  (+) Chipped   Pulmonary neg pulmonary ROS, neg shortness of breath   Pulmonary exam normal        Cardiovascular Exercise Tolerance: Good hypertension, + CAD, + Past MI and + Cardiac Stents  Normal cardiovascular exam+ dysrhythmias      Neuro/Psych negative neurological ROS  negative psych ROS   GI/Hepatic negative GI ROS, Neg liver ROS,neg GERD  ,,  Endo/Other  Hypothyroidism    Renal/GU      Musculoskeletal   Abdominal   Peds  Hematology negative hematology ROS (+)   Anesthesia Other Findings Past Medical History: 07/2014: Coronary artery disease     Comment:  a.) LHC/PCI 07/26/2014: 95% pLAD (3.0 x 15 mm Integrity               Resolute DES) No date: Family history of adverse reaction to anesthesia     Comment:   brother went into coma (hx aids weak immune system               chronic pneumonia) No date: History of kidney stones No date: Hypercholesteremia No date: Hypertension 07/25/2014: Hypothyroidism     Comment:  a.) s/p partial thyroidectomy No date: Ischemic heart disease due to coronary artery obstruction  ([place]) No date: Long term current use of antithrombotics/antiplatelets     Comment:  a.) on DAPT (ASA + prasugrel) 07/25/2014: NSTEMI (non-ST elevated myocardial infarction) ([place])     Comment:  a.) LHC/PCI 07/26/2014: 95% pLAD (3.0 x 15 mm Integrity               Resolute DES) No date: Palpitations No date: Pre-diabetes No date: RBBB (right bundle branch block) No date: Rotator cuff arthropathy, left No date: Spermatocele     Comment:  a.) s/p LEFT  spermatocelectomy 01/06/2020 2008: Thyroid mass     Comment:  a.) s/p partial thyroidectomy; pathology benign  Past Surgical History: 08/17/2020: ACHILLES TENDON SURGERY; Right     Comment:  Procedure: ACHILLES TENDON REPAIR RIGHT;  Surgeon:               Samara Deist, DPM;  Location: [facility] OR[facility];  Service:               Podiatry;  Laterality: Right; 08/2016: COLONOSCOPY     Comment:  Physcian requested to do again in 3 years. Polyps were               found. 07/26/2014: CORONARY ANGIOPLASTY WITH STENT PLACEMENT     Comment:  Procedure: CORONARY ANGIOPLASTY WITH STENT PLACEMENT;               Location: [facility]; Surgeon: Jacques Earthly, MD 08/17/2020: OSTECTOMY; Right     Comment:  Procedure: OSTECTOMY-HAGLUNDS/RETROCALCANEAL;  Surgeon:               Samara Deist, DPM;  Location: [facility];  Service:               Podiatry;  Laterality: Right; 01/06/2020: SPERMATOCELECTOMY; Left     Comment:  Procedure: [id number];  Surgeon: Nickolas Madrid  C, MD;  Location: [facility];  Service: Urology;                Laterality: Left; 2008: THYROIDECTOMY, PARTIAL     Reproductive/Obstetrics negative OB ROS                             Anesthesia Physical Anesthesia Plan  ASA: 3  Anesthesia Plan: General ETT   Post-op Pain Management:    Induction: Intravenous  PONV Risk Score and Plan: Ondansetron, Dexamethasone, Midazolam and Treatment may vary due to age or medical condition  Airway Management Planned: Oral ETT  Additional Equipment:   Intra-op Plan:   Post-operative Plan: Extubation in OR  Informed Consent: I have reviewed the patients History and Physical, chart, labs and discussed the procedure including the risks, benefits and alternatives for the proposed anesthesia with the patient or authorized representative who has indicated his/her understanding and acceptance.     Dental Advisory Given  Plan Discussed with: Anesthesiologist,  CRNA and Surgeon  Anesthesia Plan Comments: (Patient consented for risks of anesthesia including but not limited to:  - adverse reactions to medications - damage to eyes, teeth, lips or other oral mucosa - nerve damage due to positioning  - sore throat or hoarseness - Damage to heart, brain, nerves, lungs, other parts of body or loss of life  Patient voiced understanding.)       Anesthesia Quick Evaluation

## 2022-08-07 NOTE — Transfer of Care (Signed)
Immediate Anesthesia Transfer of Care Note  Patient: Richard Underwood.  Procedure(s) Performed: SHOULDER ARTHROSCOPY WITH OPEN ROTATOR CUFF DEBRIDEMENT AND DISTAL CLAVICLE ACROMINECTOMY (Left)  Patient Location: PACU  Anesthesia Type:General  Level of Consciousness: drowsy and patient cooperative  Airway & Oxygen Therapy: Patient Spontanous Breathing and Patient connected to face mask oxygen  Post-op Assessment: Report given to RN and Post -op Vital signs reviewed and stable  Post vital signs: Reviewed and stable  Last Vitals:  Vitals Value Taken Time  BP 148/74 08/07/22 1522  Temp    Pulse 79 08/07/22 1526  Resp    SpO2 95 % 08/07/22 1526  Vitals shown include unvalidated device data.  Last Pain:  Vitals:   08/07/22 0940  TempSrc: Tympanic         Complications: No notable events documented.

## 2022-08-08 ENCOUNTER — Encounter: Payer: Self-pay | Admitting: Orthopedic Surgery

## 2022-08-08 MED FILL — Ringer's Solution For Irrigation: Qty: 8000 | Status: AC

## 2022-08-08 MED FILL — Ringer's Solution For Irrigation: Qty: 6000 | Status: AC

## 2022-08-18 ENCOUNTER — Encounter: Payer: 59 | Admitting: Internal Medicine

## 2022-08-19 ENCOUNTER — Encounter: Payer: Self-pay | Admitting: Internal Medicine

## 2022-08-19 ENCOUNTER — Ambulatory Visit (INDEPENDENT_AMBULATORY_CARE_PROVIDER_SITE_OTHER): Payer: 59 | Admitting: Internal Medicine

## 2022-08-19 VITALS — BP 142/90 | HR 73 | Ht 68.0 in | Wt 245.0 lb

## 2022-08-19 DIAGNOSIS — Z9889 Other specified postprocedural states: Secondary | ICD-10-CM

## 2022-08-19 DIAGNOSIS — E782 Mixed hyperlipidemia: Secondary | ICD-10-CM | POA: Diagnosis not present

## 2022-08-19 DIAGNOSIS — R7303 Prediabetes: Secondary | ICD-10-CM | POA: Diagnosis not present

## 2022-08-19 DIAGNOSIS — I10 Essential (primary) hypertension: Secondary | ICD-10-CM

## 2022-08-19 DIAGNOSIS — D6869 Other thrombophilia: Secondary | ICD-10-CM

## 2022-08-19 DIAGNOSIS — Z0000 Encounter for general adult medical examination without abnormal findings: Secondary | ICD-10-CM | POA: Diagnosis not present

## 2022-08-19 DIAGNOSIS — Z125 Encounter for screening for malignant neoplasm of prostate: Secondary | ICD-10-CM

## 2022-08-19 DIAGNOSIS — Z7729 Contact with and (suspected ) exposure to other hazardous substances: Secondary | ICD-10-CM | POA: Insufficient documentation

## 2022-08-19 LAB — POCT URINALYSIS DIPSTICK
Bilirubin, UA: NEGATIVE
Blood, UA: NEGATIVE
Glucose, UA: NEGATIVE
Ketones, UA: NEGATIVE
Leukocytes, UA: NEGATIVE
Nitrite, UA: NEGATIVE
Protein, UA: NEGATIVE
Spec Grav, UA: >=1.03 — AB (ref 1.010–1.025)
Urobilinogen, UA: 0.2 E.U./dL
pH, UA: 6 (ref 5.0–8.0)

## 2022-08-19 NOTE — Progress Notes (Signed)
Date:  08/19/2022   Name:  Richard Underwood [facility].   DOB:  February 07, 1961   MRN:  [id number]   Chief Complaint: Annual Exam Richard Underwood. is a 61 y.o. male who presents today for his Complete Annual Exam. He feels well. He reports exercising some. He reports he is sleeping fairly well.   Colonoscopy: 09/2019 repeat 3 yrs  Immunization History  Administered Date(s) Administered   PNEUMOCOCCAL CONJUGATE-20 08/14/2021   Tdap 07/16/2018   Zoster Recombinat (Shingrix) 01/17/2019, 04/21/2019   Health Maintenance Due  Topic Date Due   HIV Screening  Never done    Lab Results  Component Value Date   PSA1 1.1 08/14/2021   PSA1 0.9 08/08/2020   PSA1 0.8 07/26/2019    Hypertension This is a chronic problem. The problem is controlled. Pertinent negatives include no chest pain, headaches, palpitations or shortness of breath. Past treatments include diuretics, beta blockers and angiotensin blockers. The current treatment provides moderate improvement. Hypertensive end-organ damage includes CAD/MI.  Hyperlipidemia This is a chronic problem. The problem is controlled. Pertinent negatives include no chest pain, myalgias or shortness of breath. Current antihyperlipidemic treatment includes statins. The current treatment provides significant improvement of lipids.  Diabetes He presents for his follow-up diabetic visit. Diabetes type: prediabetes. Pertinent negatives for hypoglycemia include no dizziness, headaches or nervousness/anxiousness. Pertinent negatives for diabetes include no chest pain and no fatigue. Current diabetic treatment includes diet. An ACE inhibitor/angiotensin II receptor blocker is being taken.    Lab Results  Component Value Date   NA 141 03/20/2022   K 4.5 03/20/2022   CO2 26 03/20/2022   GLUCOSE 94 03/20/2022   BUN 15 03/20/2022   CREATININE 1.14 03/20/2022   CALCIUM 9.7 03/20/2022   EGFR 73 03/20/2022   GFRNONAA 77 08/08/2020   Lab Results  Component  Value Date   CHOL 139 03/20/2022   HDL 47 03/20/2022   LDLCALC 64 03/20/2022   TRIG 166 (H) 03/20/2022   CHOLHDL 3.0 03/20/2022   Lab Results  Component Value Date   TSH 2.120 07/26/2019   Lab Results  Component Value Date   HGBA1C 5.9 (H) 03/20/2022   Lab Results  Component Value Date   WBC 8.3 08/14/2021   HGB 14.8 08/14/2021   HCT 43.4 08/14/2021   MCV 81 08/14/2021   PLT 211 08/14/2021   Lab Results  Component Value Date   ALT 15 03/20/2022   AST 22 03/20/2022   ALKPHOS 94 03/20/2022   BILITOT 0.3 03/20/2022   Lab Results  Component Value Date   VD25OH 42.8 08/08/2020     Review of Systems  Constitutional:  Negative for appetite change, chills, diaphoresis, fatigue and unexpected weight change.  HENT:  Negative for hearing loss, tinnitus, trouble swallowing and voice change.   Eyes:  Negative for visual disturbance.  Respiratory:  Negative for choking, shortness of breath and wheezing.   Cardiovascular:  Negative for chest pain, palpitations and leg swelling.  Gastrointestinal:  Negative for abdominal pain, blood in stool, constipation and diarrhea.  Genitourinary:  Negative for difficulty urinating, dysuria and frequency.  Musculoskeletal:  Negative for arthralgias, back pain and myalgias.  Skin:  Negative for color change and rash.  Neurological:  Negative for dizziness, syncope and headaches.  Hematological:  Negative for adenopathy.  Psychiatric/Behavioral:  Negative for dysphoric mood and sleep disturbance. The patient is not nervous/anxious.     Patient Active Problem List   Diagnosis Date Noted   Exposure  to potentially hazardous substance 08/19/2022   Prediabetes 02/08/2021   Acquired thrombophilia ([place]) 02/08/2021   Digital mucous cyst of finger 08/08/2020   Tubular adenoma of colon 07/16/2018   Hypogonadism in male 11/11/2016   Ischemic heart disease due to coronary artery obstruction ([place]) 08/07/2014   Mixed hyperlipidemia 07/25/2014    Essential hypertension 07/25/2014   Palpitations 07/25/2014    Allergies  Allergen Reactions   Septra [Sulfamethoxazole-Trimethoprim]     Blistered on penis   Tetracyclines & Related Rash    Blister of skin    Past Surgical History:  Procedure Laterality Date   ACHILLES TENDON SURGERY Right 08/17/2020   Procedure: ACHILLES TENDON REPAIR RIGHT;  Surgeon: Samara Deist, DPM;  Location: [facility];  Service: Podiatry;  Laterality: Right;   COLONOSCOPY  08/2016   Physcian requested to do again in 3 years. Polyps were found.   CORONARY ANGIOPLASTY WITH STENT PLACEMENT  07/26/2014   Procedure: CORONARY ANGIOPLASTY WITH STENT PLACEMENT; Location: [facility]; Surgeon: Jacques Earthly, MD   OSTECTOMY Right 08/17/2020   Procedure: OSTECTOMY-HAGLUNDS/RETROCALCANEAL;  Surgeon: Samara Deist, DPM;  Location: [facility];  Service: Podiatry;  Laterality: Right;   SHOULDER ARTHROSCOPY WITH OPEN ROTATOR CUFF REPAIR AND DISTAL CLAVICLE ACROMINECTOMY Left 08/07/2022   Procedure: SHOULDER ARTHROSCOPY WITH OPEN ROTATOR CUFF DEBRIDEMENT AND DISTAL CLAVICLE ACROMINECTOMY;  Surgeon: Thornton Park, MD;  Location: [facility];  Service: Orthopedics;  Laterality: Left;   SPERMATOCELECTOMY Left 01/06/2020   Procedure: SPERMATOCELECTOMY;  Surgeon: Billey Co, MD;  Location: [facility];  Service: Urology;  Laterality: Left;   THYROIDECTOMY, PARTIAL  2008    Social History   Tobacco Use   Smoking status: Never   Smokeless tobacco: Never  Vaping Use   Vaping Use: Never used  Substance Use Topics   Alcohol use: Yes    Alcohol/week: 1.0 standard drink of alcohol    Types: 1 Cans of beer per week    Comment: occassionally   Drug use: No     Medication list has been reviewed and updated.  Current Meds  Medication Sig   aspirin EC 81 MG tablet Take 81 mg by mouth daily.    hydrochlorothiazide (HYDRODIURIL) 25 MG tablet Take 0.5 tablets by mouth daily.   loratadine (CLARITIN) 10 MG tablet Take 10 mg by mouth  daily.   metoprolol succinate (TOPROL-XL) 50 MG 24 hr tablet Take 50 mg by mouth daily. Take with or immediately following a meal.   Misc Natural Products (JOINT HEALTH PO) Take 2 tablets by mouth daily.   Multiple Vitamin (ONE DAILY) tablet Take 1 tablet by mouth daily.    ondansetron (ZOFRAN) 4 MG tablet Take 1 tablet (4 mg total) by mouth every 8 (eight) hours as needed for nausea or vomiting.   prasugrel (EFFIENT) 10 MG TABS tablet Take 10 mg by mouth daily.   rosuvastatin (CRESTOR) 40 MG tablet TAKE 1 TABLET DAILY   telmisartan-hydrochlorothiazide (MICARDIS HCT) 80-25 MG tablet TAKE 1 TABLET DAILY   [DISCONTINUED] oxyCODONE (OXY IR/ROXICODONE) 5 MG immediate release tablet Take 1 tablet (5 mg total) by mouth every 4 (four) hours as needed for severe pain.   [DISCONTINUED] tiZANidine (ZANAFLEX) 4 MG tablet Take 1 tablet by mouth every 8 (eight) hours as needed.       08/19/2022   10:09 AM 03/19/2022    1:26 PM 08/14/2021    8:29 AM 02/08/2021    9:40 AM  GAD 7 : Generalized Anxiety Score  Nervous, Anxious, on Edge 0 0 0  0  Control/stop worrying 0 0 0 0  Worry too much - different things 0 0 0 0  Trouble relaxing 0 0 0 0  Restless 0 0 0 0  Easily annoyed or irritable 0 0 0 0  Afraid - awful might happen 0 0 0 0  Total GAD 7 Score 0 0 0 0  Anxiety Difficulty Not difficult at all Not difficult at all Not difficult at all        08/19/2022   10:09 AM 03/19/2022    1:25 PM 08/14/2021    8:28 AM  Depression screen PHQ 2/9  Decreased Interest 0 0 0  Down, Depressed, Hopeless 0 1 0  PHQ - 2 Score 0 1 0  Altered sleeping 0 0 0  Tired, decreased energy 0 0 0  Change in appetite 0 0 0  Feeling bad or failure about yourself  0 0 0  Trouble concentrating 0 0 0  Moving slowly or fidgety/restless 0 0 0  Suicidal thoughts 0 0 0  PHQ-9 Score 0 1 0  Difficult doing work/chores Not difficult at all Not difficult at all Not difficult at all    BP Readings from Last 3 Encounters:   08/19/22 (!) 142/90  08/07/22 (!) 155/89  07/15/22 (!) 144/87    Physical Exam Vitals and nursing note reviewed.  Constitutional:      Appearance: Normal appearance. He is well-developed.  HENT:     Head: Normocephalic.     Right Ear: Tympanic membrane, ear canal and external ear normal.     Left Ear: Tympanic membrane, ear canal and external ear normal.     Nose: Nose normal.  Eyes:     Conjunctiva/sclera: Conjunctivae normal.     Pupils: Pupils are equal, round, and reactive to light.  Neck:     Thyroid: No thyromegaly.     Vascular: No carotid bruit.  Cardiovascular:     Rate and Rhythm: Normal rate and regular rhythm.     Heart sounds: Normal heart sounds.  Pulmonary:     Effort: Pulmonary effort is normal.     Breath sounds: Normal breath sounds. No wheezing.  Chest:  Breasts:    Right: No mass.     Left: No mass.  Abdominal:     General: Bowel sounds are normal.     Palpations: Abdomen is soft.     Tenderness: There is no abdominal tenderness.  Musculoskeletal:     Left shoulder: Decreased range of motion.     Cervical back: Normal range of motion and neck supple.  Lymphadenopathy:     Cervical: No cervical adenopathy.  Skin:    General: Skin is warm and dry.  Neurological:     Mental Status: He is alert and oriented to person, place, and time.     Deep Tendon Reflexes: Reflexes are normal and symmetric.  Psychiatric:        Attention and Perception: Attention normal.        Mood and Affect: Mood normal.        Thought Content: Thought content normal.     Wt Readings from Last 3 Encounters:  08/19/22 245 lb (111.1 kg)  08/07/22 239 lb 15.9 oz (108.9 kg)  08/04/22 240 lb (108.9 kg)    BP (!) 142/90 (BP Location: Left Arm, Cuff Size: Large)   Pulse 73   Ht _0  (1.727 m)   Wt 245 lb (111.1 kg)   SpO2 95%   BMI 37.25 kg/m  Assessment and Plan: 1. Annual physical exam Exam is normal except for weight. Encourage regular exercise and appropriate  dietary changes. Recent shoulder surgery healing well. Tranferring care to [facility] - CBC with Differential/Platelet - Comprehensive metabolic panel - Hemoglobin A1c - Lipid panel - PSA - POCT urinalysis dipstick  2. Prostate cancer screening DRE deferred - PSA  3. Essential hypertension BP controlled at home and at the [facility] Will monitor and continue current medications Low sodium diet recommended - CBC with Differential/Platelet - Comprehensive metabolic panel - POCT urinalysis dipstick  4. Prediabetes Continue dietary efforts; avoid further weight gain - Comprehensive metabolic panel - Hemoglobin A1c  5. Mixed hyperlipidemia Tolerating statin medication without side effects at this time LDL is at goal of < 70 on current dose Continue same therapy without change at this time. - Comprehensive metabolic panel - Lipid panel  6. Acquired thrombophilia (HCC) On Effient daily  S/p PTCA and Stent placement - CBC with Differential/Platelet  7. S/P shoulder surgery Recovering as expected with minimal pain   Partially dictated using Editor, commissioning. Any errors are unintentional.  Halina Maidens, MD [facility]  08/19/2022

## 2022-08-21 LAB — CBC WITH DIFFERENTIAL/PLATELET
Basophils Absolute: 0.1 10*3/uL (ref 0.0–0.2)
Basos: 1 %
EOS (ABSOLUTE): 0.3 10*3/uL (ref 0.0–0.4)
Eos: 4 %
Hematocrit: 41.5 % (ref 37.5–51.0)
Hemoglobin: 14.2 g/dL (ref 13.0–17.7)
Immature Grans (Abs): 0.1 10*3/uL (ref 0.0–0.1)
Immature Granulocytes: 1 %
Lymphocytes Absolute: 2.9 10*3/uL (ref 0.7–3.1)
Lymphs: 33 %
MCH: 28 pg (ref 26.6–33.0)
MCHC: 34.2 g/dL (ref 31.5–35.7)
MCV: 82 fL (ref 79–97)
Monocytes Absolute: 0.8 10*3/uL (ref 0.1–0.9)
Monocytes: 9 %
Neutrophils Absolute: 4.7 10*3/uL (ref 1.4–7.0)
Neutrophils: 52 %
Platelets: 202 10*3/uL (ref 150–450)
RBC: 5.07 x10E6/uL (ref 4.14–5.80)
RDW: 13.2 % (ref 11.6–15.4)
WBC: 8.9 10*3/uL (ref 3.4–10.8)

## 2022-08-21 LAB — HEMOGLOBIN A1C
Est. average glucose Bld gHb Est-mCnc: 126 mg/dL
Hgb A1c MFr Bld: 6 % — ABNORMAL HIGH (ref 4.8–5.6)

## 2022-08-21 LAB — COMPREHENSIVE METABOLIC PANEL
ALT: 22 IU/L (ref 0–44)
AST: 24 IU/L (ref 0–40)
Albumin/Globulin Ratio: 1.6 (ref 1.2–2.2)
Albumin: 4.2 g/dL (ref 3.9–4.9)
Alkaline Phosphatase: 106 IU/L (ref 44–121)
BUN/Creatinine Ratio: 16 (ref 10–24)
BUN: 16 mg/dL (ref 8–27)
Bilirubin Total: 0.3 mg/dL (ref 0.0–1.2)
CO2: 25 mmol/L (ref 20–29)
Calcium: 9.6 mg/dL (ref 8.6–10.2)
Chloride: 101 mmol/L (ref 96–106)
Creatinine, Ser: 0.99 mg/dL (ref 0.76–1.27)
Globulin, Total: 2.7 g/dL (ref 1.5–4.5)
Glucose: 99 mg/dL (ref 70–99)
Potassium: 4.1 mmol/L (ref 3.5–5.2)
Sodium: 141 mmol/L (ref 134–144)
Total Protein: 6.9 g/dL (ref 6.0–8.5)
eGFR: 86 mL/min/{1.73_m2} (ref >59)

## 2022-08-21 LAB — LIPID PANEL
Chol/HDL Ratio: 3 ratio (ref 0.0–5.0)
Cholesterol, Total: 158 mg/dL (ref 100–199)
HDL: 52 mg/dL (ref >39)
LDL Chol Calc (NIH): 76 mg/dL (ref 0–99)
Triglycerides: 175 mg/dL — ABNORMAL HIGH (ref 0–149)
VLDL Cholesterol Cal: 30 mg/dL (ref 5–40)

## 2022-08-21 LAB — PSA: Prostate Specific Ag, Serum: 1.2 ng/mL (ref 0.0–4.0)

## 2022-09-04 ENCOUNTER — Other Ambulatory Visit: Payer: Self-pay | Admitting: Internal Medicine

## 2022-09-04 DIAGNOSIS — I10 Essential (primary) hypertension: Secondary | ICD-10-CM

## 2023-01-21 ENCOUNTER — Telehealth: Payer: Self-pay

## 2023-01-21 NOTE — Telephone Encounter (Signed)
Adriana from [facility] calling to report CT chest that was done today and reports incidental finding of thyroid nodule and recommended [place]. Advised I could see where results were faxed and will pass along to provider. No further assistance needed.

## 2023-01-22 NOTE — Telephone Encounter (Signed)
Please review.  KP

## 2024-02-22 ENCOUNTER — Ambulatory Visit
Admission: EM | Admit: 2024-02-22 | Discharge: 2024-02-22 | Disposition: A | Discharge status: Home / Self Care | Attending: Emergency Medicine | Admitting: Emergency Medicine

## 2024-02-22 DIAGNOSIS — L03221 Cellulitis of neck: Secondary | ICD-10-CM | POA: Diagnosis not present

## 2024-02-22 MED ORDER — CEPHALEXIN 500 MG PO CAPS: 500.0000 mg | ORAL_CAPSULE | Freq: Three times a day (TID) | ORAL | 0 refills | Status: AC

## 2024-02-22 NOTE — ED Triage Notes (Signed)
 Insect bite under chin that happened yesterday. Itches but doesn't hurt.

## 2024-02-22 NOTE — ED Provider Notes (Signed)
 [facility]    CSN: [id number] Arrival date & time: 02/22/24  0809      History   Chief Complaint Chief Complaint  Patient presents with   Insect Bite    HPI Richard Underwood. is a 63 y.o. male.   HPI  63 year old male with past medical history significant for CAD, hypertension, high cholesterol, hypothyroidism, and STEMI, right bundle branch block, prediabetes, and palpitations presents for evaluation of an insect bite under his chin that he sustained yesterday morning at 9 AM.  He reports that he just finished his walking he was driving when he felt multiple stings under his chin.  He brushed the area but did not see an insect of any kind.  He had significant swelling down low on his anterior neck yesterday that is moved up more under his chin today.  He denies any fever or drainage.  He is here at the advice of his daughter who is concerned he may be developing cellulitis.  Past Medical History:  Diagnosis Date   Coronary artery disease 07/2014   a.) LHC/PCI 07/26/2014: 95% pLAD (3.0 x 15 mm Integrity Resolute DES)   Family history of adverse reaction to anesthesia     brother went into coma (hx aids weak immune system chronic pneumonia)   History of kidney stones    Hypercholesteremia    Hypertension    Hypothyroidism 07/25/2014   a.) s/p partial thyroidectomy   Ischemic heart disease due to coronary artery obstruction (HCC)    Long term current use of antithrombotics/antiplatelets    a.) on DAPT (ASA + prasugrel)   NSTEMI (non-ST elevated myocardial infarction) (HCC) 07/25/2014   a.) LHC/PCI 07/26/2014: 95% pLAD (3.0 x 15 mm Integrity Resolute DES)   Palpitations    Pre-diabetes    RBBB (right bundle branch block)    Rotator cuff arthropathy, left    Spermatocele    a.) s/p LEFT spermatocelectomy 01/06/2020   Thyroid  mass 2008   a.) s/p partial thyroidectomy; pathology benign    Patient Active Problem List   Diagnosis Date Noted   Exposure to  potentially hazardous substance 08/19/2022   Prediabetes 02/08/2021   Acquired thrombophilia (HCC) 02/08/2021   Digital mucous cyst of finger 08/08/2020   Tubular adenoma of colon 07/16/2018   Hypogonadism in male 11/11/2016   Ischemic heart disease due to coronary artery obstruction (HCC) 08/07/2014   Mixed hyperlipidemia 07/25/2014   Essential hypertension 07/25/2014   Palpitations 07/25/2014    Past Surgical History:  Procedure Laterality Date   ACHILLES TENDON SURGERY Right 08/17/2020   Procedure: ACHILLES TENDON REPAIR RIGHT;  Surgeon: Ashley Soulier, DPM;  Location: [facility];  Service: Podiatry;  Laterality: Right;   COLONOSCOPY  08/2016   Physcian requested to do again in 3 years. Polyps were found.   CORONARY ANGIOPLASTY WITH STENT PLACEMENT  07/26/2014   Procedure: CORONARY ANGIOPLASTY WITH STENT PLACEMENT; Location: [facility]; Surgeon: Zachary Car, MD   OSTECTOMY Right 08/17/2020   Procedure: OSTECTOMY-HAGLUNDS/RETROCALCANEAL;  Surgeon: Ashley Soulier, DPM;  Location: [facility];  Service: Podiatry;  Laterality: Right;   SHOULDER ARTHROSCOPY WITH OPEN ROTATOR CUFF REPAIR AND DISTAL CLAVICLE ACROMINECTOMY Left 08/07/2022   Procedure: SHOULDER ARTHROSCOPY WITH OPEN ROTATOR CUFF DEBRIDEMENT AND DISTAL CLAVICLE ACROMINECTOMY;  Surgeon: Marchia Drivers, MD;  Location: [facility];  Service: Orthopedics;  Laterality: Left;   SPERMATOCELECTOMY Left 01/06/2020   Procedure: SPERMATOCELECTOMY;  Surgeon: Francisca Redell BROCKS, MD;  Location: [facility];  Service: Urology;  Laterality: Left;  THYROIDECTOMY, PARTIAL  2008       Home Medications    Prior to Admission medications   Medication Sig Start Date End Date Taking? Authorizing Provider  aspirin EC 81 MG tablet Take 81 mg by mouth daily.    Yes [provider]  cephALEXin  (KEFLEX ) 500 MG capsule Take 1 capsule (500 mg total) by mouth 3 (three) times daily for 7 days. 02/22/24 02/29/24 Yes Bernardino Ditch, NP  hydrochlorothiazide  (HYDRODIURIL) 25 MG tablet Take 0.5 tablets by mouth daily. 07/29/22  Yes [provider]  metoprolol succinate (TOPROL-XL) 50 MG 24 hr tablet Take 50 mg by mouth daily. Take with or immediately following a meal.   Yes [provider]  rosuvastatin  (CRESTOR ) 40 MG tablet TAKE 1 TABLET DAILY 07/29/22  Yes Berglund, Laura H, MD  loratadine (CLARITIN) 10 MG tablet Take 10 mg by mouth daily.    [provider]  Misc Natural Products (JOINT HEALTH PO) Take 2 tablets by mouth daily.    [provider]  Multiple Vitamin (ONE DAILY) tablet Take 1 tablet by mouth daily.     [provider]  ondansetron  (ZOFRAN ) 4 MG tablet Take 1 tablet (4 mg total) by mouth every 8 (eight) hours as needed for nausea or vomiting. 08/07/22   Krasinski, Kevin, MD  prasugrel (EFFIENT) 10 MG TABS tablet Take 10 mg by mouth daily.    [provider]  telmisartan -hydrochlorothiazide (MICARDIS  HCT) 80-25 MG tablet TAKE 1 TABLET DAILY 06/16/22   Justus Leita DEL, MD    Family History Family History  Problem Relation Age of Onset   Diabetes Mother    Hypertension Mother    Diabetes Maternal Grandmother    Diabetes Maternal Grandfather     Social History Social History   Tobacco Use   Smoking status: Never   Smokeless tobacco: Never  Vaping Use   Vaping status: Never Used  Substance Use Topics   Alcohol use: Yes    Alcohol/week: 1.0 standard drink of alcohol    Types: 1 Cans of beer per week    Comment: occassionally   Drug use: No     Allergies   Septra [sulfamethoxazole-trimethoprim] and Tetracyclines & related   Review of Systems Review of Systems  Constitutional:  Negative for fever.  Skin:  Positive for color change. Negative for wound.     Physical Exam Triage Vital Signs ED Triage Vitals  Encounter Vitals Group     BP      Girls Systolic BP Percentile      Girls Diastolic BP Percentile      Boys Systolic BP Percentile      Boys  Diastolic BP Percentile      Pulse      Resp      Temp      Temp src      SpO2      Weight      Height      Head Circumference      Peak Flow      Pain Score      Pain Loc      Pain Education      Exclude from Growth Chart    No data found.  Updated Vital Signs BP 137/83 (BP Location: Right Arm)   Pulse 64   Temp 98.8 F (37.1 C) (Oral)   Resp 16   SpO2 95%   Visual Acuity Right Eye Distance:   Left Eye Distance:   Bilateral Distance:  Right Eye Near:   Left Eye Near:    Bilateral Near:     Physical Exam Vitals and nursing note reviewed.  Constitutional:      Appearance: Normal appearance. He is not ill-appearing.  HENT:     Head: Normocephalic and atraumatic.   Skin:    General: Skin is warm and dry.     Capillary Refill: Capillary refill takes less than 2 seconds.     Findings: Erythema present. No lesion.   Neurological:     General: No focal deficit present.     Mental Status: He is alert and oriented to person, place, and time.      UC Treatments / Results  Labs (all labs ordered are listed, but only abnormal results are displayed) Labs Reviewed - No data to display  EKG   Radiology No results found.  Procedures Procedures (including critical care time)  Medications Ordered in UC Medications - No data to display  Initial Impression / Assessment and Plan / UC Course  I have reviewed the triage vital signs and the nursing notes.  Pertinent labs & imaging results that were available during my care of the patient were reviewed by me and considered in my medical decision making (see chart for details).   Patient is a pleasant, nontoxic-appearing 63 year old male presenting for evaluation of redness underneath his chin as outlined in HPI above.  As you can see in image above, there is some mild erythema that is under the patient's chin.  No visible site of envenomation.  The area of erythema is itchy per the patient's report and it also is  warm to touch and mildly tender.  No induration or fluctuance.  Etiology of the erythema is unclear though I do not suspect an insect bite.  Down lower on his neck there are some erythematous areas that appear to be an abrasion that may be from the patient scratching.  I will treat the patient for possible developing cellulitis with Keflex  500 mg 3 times daily x 7 days as he is allergic to sulfa and tetracyclines.  Return precautions reviewed.   Final Clinical Impressions(s) / UC Diagnoses   Final diagnoses:  Cellulitis of neck     Discharge Instructions      Take the Keflex  500 mg 3 times a day with food for 7 days to treat your possible developing cellulitis.  You may use over-the-counter Tylenol  and/or ibuprofen as needed for any fever or pain.  For the sting and itching use over-the-counter antihistamine such as Claritin, Zyrtec, or Allegra during the day and use Benadryl at nighttime.  Monitor the area of redness for any increased redness, swelling, heat, pus drainage, or if you develop a fever.  If these conditions arise you need to be reevaluated either in urgent care or by your primary care provider.     ED Prescriptions     Medication Sig Dispense Auth. Provider   cephALEXin  (KEFLEX ) 500 MG capsule Take 1 capsule (500 mg total) by mouth 3 (three) times daily for 7 days. 21 capsule Bernardino Ditch, NP      PDMP not reviewed this encounter.   Bernardino Ditch, NP 02/22/24 [phone]

## 2024-02-22 NOTE — Discharge Instructions (Addendum)
 Take the Keflex  500 mg 3 times a day with food for 7 days to treat your possible developing cellulitis.  You may use over-the-counter Tylenol  and/or ibuprofen as needed for any fever or pain.  For the sting and itching use over-the-counter antihistamine such as Claritin, Zyrtec, or Allegra during the day and use Benadryl at nighttime.  Monitor the area of redness for any increased redness, swelling, heat, pus drainage, or if you develop a fever.  If these conditions arise you need to be reevaluated either in urgent care or by your primary care provider.
